# Patient Record
Sex: Female | Born: 1965 | Hispanic: Yes | Marital: Married | Smoking: Never smoker
Health system: Southern US, Community
[De-identification: ages and names within clinical notes are randomized; demographics above are authoritative.]

## PROBLEM LIST (undated history)

## (undated) DIAGNOSIS — I8390 Asymptomatic varicose veins of unspecified lower extremity: Secondary | ICD-10-CM

## (undated) DIAGNOSIS — D649 Anemia, unspecified: Secondary | ICD-10-CM

## (undated) DIAGNOSIS — J3489 Other specified disorders of nose and nasal sinuses: Secondary | ICD-10-CM

## (undated) DIAGNOSIS — R112 Nausea with vomiting, unspecified: Secondary | ICD-10-CM

## (undated) DIAGNOSIS — R631 Polydipsia: Secondary | ICD-10-CM

## (undated) DIAGNOSIS — Z8489 Family history of other specified conditions: Secondary | ICD-10-CM

## (undated) DIAGNOSIS — R7303 Prediabetes: Secondary | ICD-10-CM

## (undated) DIAGNOSIS — R197 Diarrhea, unspecified: Secondary | ICD-10-CM

## (undated) DIAGNOSIS — R140 Abdominal distension (gaseous): Secondary | ICD-10-CM

## (undated) DIAGNOSIS — M7989 Other specified soft tissue disorders: Secondary | ICD-10-CM

## (undated) DIAGNOSIS — K8020 Calculus of gallbladder without cholecystitis without obstruction: Secondary | ICD-10-CM

## (undated) HISTORY — PX: TUBAL LIGATION: SHX77

## (undated) HISTORY — DX: Diarrhea, unspecified: R19.7

## (undated) HISTORY — PX: ABDOMINAL HYSTERECTOMY: SHX81

## (undated) HISTORY — DX: Abdominal distension (gaseous): R14.0

## (undated) HISTORY — PX: CHOLECYSTECTOMY: SHX55

## (undated) HISTORY — DX: Anemia, unspecified: D64.9

## (undated) HISTORY — DX: Calculus of gallbladder without cholecystitis without obstruction: K80.20

## (undated) HISTORY — DX: Nausea with vomiting, unspecified: R11.2

---

## 2004-10-23 ENCOUNTER — Encounter (INDEPENDENT_AMBULATORY_CARE_PROVIDER_SITE_OTHER): Payer: Self-pay | Admitting: *Deleted

## 2004-10-23 LAB — CONVERTED CEMR LAB

## 2004-11-06 ENCOUNTER — Ambulatory Visit: Payer: Self-pay | Admitting: Family Medicine

## 2004-11-07 ENCOUNTER — Ambulatory Visit: Payer: Self-pay | Admitting: Family Medicine

## 2004-11-13 ENCOUNTER — Ambulatory Visit: Payer: Self-pay | Admitting: Family Medicine

## 2004-11-22 ENCOUNTER — Encounter: Admission: RE | Admit: 2004-11-22 | Discharge: 2004-11-22 | Payer: Self-pay | Admitting: Sports Medicine

## 2005-07-16 ENCOUNTER — Ambulatory Visit: Payer: Self-pay | Admitting: Family Medicine

## 2006-08-23 ENCOUNTER — Encounter (INDEPENDENT_AMBULATORY_CARE_PROVIDER_SITE_OTHER): Payer: Self-pay | Admitting: *Deleted

## 2010-10-03 ENCOUNTER — Other Ambulatory Visit (HOSPITAL_COMMUNITY): Payer: Self-pay | Admitting: Family Medicine

## 2010-10-03 DIAGNOSIS — Z8719 Personal history of other diseases of the digestive system: Secondary | ICD-10-CM

## 2010-10-05 ENCOUNTER — Ambulatory Visit (HOSPITAL_COMMUNITY)
Admission: RE | Admit: 2010-10-05 | Discharge: 2010-10-05 | Disposition: A | Discharge status: Home / Self Care | Payer: Self-pay | Source: Ambulatory Visit | Attending: Family Medicine | Admitting: Family Medicine

## 2010-10-05 DIAGNOSIS — Z8719 Personal history of other diseases of the digestive system: Secondary | ICD-10-CM

## 2010-10-05 DIAGNOSIS — R109 Unspecified abdominal pain: Secondary | ICD-10-CM | POA: Insufficient documentation

## 2010-10-05 DIAGNOSIS — K8020 Calculus of gallbladder without cholecystitis without obstruction: Secondary | ICD-10-CM | POA: Insufficient documentation

## 2011-01-08 ENCOUNTER — Emergency Department (HOSPITAL_COMMUNITY): Payer: Self-pay

## 2011-01-08 ENCOUNTER — Emergency Department (HOSPITAL_COMMUNITY)
Admission: EM | Admit: 2011-01-08 | Discharge: 2011-01-08 | Disposition: A | Discharge status: Home / Self Care | Payer: Self-pay | Attending: Emergency Medicine | Admitting: Emergency Medicine

## 2011-01-08 ENCOUNTER — Inpatient Hospital Stay (INDEPENDENT_AMBULATORY_CARE_PROVIDER_SITE_OTHER)
Admission: RE | Admit: 2011-01-08 | Discharge: 2011-01-08 | Disposition: A | Discharge status: Home / Self Care | Payer: Self-pay | Source: Ambulatory Visit | Attending: Emergency Medicine | Admitting: Emergency Medicine

## 2011-01-08 DIAGNOSIS — K8020 Calculus of gallbladder without cholecystitis without obstruction: Secondary | ICD-10-CM | POA: Insufficient documentation

## 2011-01-08 DIAGNOSIS — R1110 Vomiting, unspecified: Secondary | ICD-10-CM | POA: Insufficient documentation

## 2011-01-08 DIAGNOSIS — R10811 Right upper quadrant abdominal tenderness: Secondary | ICD-10-CM

## 2011-01-08 DIAGNOSIS — R1011 Right upper quadrant pain: Secondary | ICD-10-CM | POA: Insufficient documentation

## 2011-01-08 LAB — DIFFERENTIAL
Basophils Absolute: 0 10*3/uL (ref 0.0–0.1)
Basophils Relative: 0 % (ref 0–1)
Lymphs Abs: 1.5 10*3/uL (ref 0.7–4.0)
Monocytes Absolute: 0.7 10*3/uL (ref 0.1–1.0)
Monocytes Relative: 10 % (ref 3–12)
Neutro Abs: 5.2 10*3/uL (ref 1.7–7.7)
Neutrophils Relative %: 69 % (ref 43–77)

## 2011-01-08 LAB — CBC
HCT: 34.6 % — ABNORMAL LOW (ref 36.0–46.0)
MCH: 26.8 pg (ref 26.0–34.0)
RBC: 4.36 MIL/uL (ref 3.87–5.11)
WBC: 7.5 10*3/uL (ref 4.0–10.5)

## 2011-01-08 LAB — URINALYSIS, ROUTINE W REFLEX MICROSCOPIC
Bilirubin Urine: NEGATIVE
Hgb urine dipstick: NEGATIVE
Ketones, ur: NEGATIVE mg/dL
Specific Gravity, Urine: 1.022 (ref 1.005–1.030)
Urobilinogen, UA: 1 mg/dL (ref 0.0–1.0)
pH: 6.5 (ref 5.0–8.0)

## 2011-01-08 LAB — BASIC METABOLIC PANEL

## 2011-01-09 ENCOUNTER — Encounter (INDEPENDENT_AMBULATORY_CARE_PROVIDER_SITE_OTHER): Payer: Self-pay | Admitting: General Surgery

## 2011-01-12 ENCOUNTER — Ambulatory Visit (INDEPENDENT_AMBULATORY_CARE_PROVIDER_SITE_OTHER): Payer: PRIVATE HEALTH INSURANCE | Admitting: General Surgery

## 2011-01-12 ENCOUNTER — Encounter (INDEPENDENT_AMBULATORY_CARE_PROVIDER_SITE_OTHER): Payer: Self-pay | Admitting: General Surgery

## 2011-01-12 VITALS — BP 112/70 | HR 68 | Temp 96.6°F | Ht 61.5 in | Wt 174.6 lb

## 2011-01-12 DIAGNOSIS — K8020 Calculus of gallbladder without cholecystitis without obstruction: Secondary | ICD-10-CM

## 2011-01-12 NOTE — Patient Instructions (Signed)
Colelitiasis, enfermedad de la vescula biliar (Colecistitis con piedras) (Cholelithiasis) Usted tiene clculos biliares. Esta es una de las formas de enfermedad de la vescula biliar. Se denomina colecistitis a la inflamacin (irritacin y Engineer, mining) de este rgano. Generalmente est originada en la formacin de piedras (clculos biliares) o sedimentos (colelitiasis). La vescula biliar no es un rgano esencial. Esto significa que no es necesaria para estar vivo. Est ubicada ligeramente a la derecha del centro del abdomen (vientre), detrs del hgado. Almacena la bilis que se produce en el hgado. La bilis interviene en la digestin de las grasas. La enfermedad de la vescula biliar puede producir nuseas (ganas de vomitar), dolor abdominal e ictericia. En los [employer], podr ser [place]. Los clculos biliares son el tipo ms comn de enfermedad de la vescula. stos comienzan como pequeos cristales y lentamente se transforman en piedras. El dolor que producen los clculos se origina en los espasmos del conducto biliar. Estos espasmos se originan en el pasaje de la piedra hacia fuera del conducto. La piedra trata de pasar al mismo tiempo que la bilis hacia el intestino delgado, durante el proceso de la digestin. Generalmente el dolor comienza de manera repentina. Puede persistir durante algunos minutos hasta algunas horas. Tambin puede producirse una infeccin. La infeccin puede sumar molestias y gravedad a un ataque agudo. El dolor puede empeorar al respirar profundamente o al irritarse. Puede haber fiebre y dolor al tacto. En algunos casos, cuando los clculos biliares no se mueven hacia el conducto biliar, las personas no sienten dolor ni presentan sntomas. stos se denominan clculos "silenciosos". Las mujeres tienen tres veces ms probabilidades de Astronomer. Las mujeres que han tenido varios embarazos tienen ms probabilidades de Copywriter, advertising  colecistitis. Algunas veces los mdicos aconsejan extirpar los clculos biliares antes de futuros embarazos. Otros factores que aumentan el riesgo de sufrir la enfermedad son la obesidad, las dietas abundantes en alimentos fritos y en productos lcteos, el aumento de la edad, [place] uso prolongado de medicamentos que contengan hormonas femeninas y Glass blower/designer. INSTRUCCIONES PARA EL CUIDADO DOMICILIARIO  Utilice los medicamentos de venta libre o de prescripcin para Chief Technology Officer, el malestar o la [place], segn se lo indique el profesional que lo asiste.   Consuma una dieta baja en grasas hasta que concurra a una nueva visita mdica. (Las grasas hacen que la vescula se Technical sales engineer).   Concurra al control como se le indic. Los ataques casi siempre son recurrentes y generalmente habr que someterse a una ciruga como [place].  SOLICITE ATENCIN MDICA DE INMEDIATO SI:  El dolor aumenta y no puede controlarlo con los medicamentos.   La temperatura oral se eleva sin motivo por encima de101 y no puede controlarla con los medicamentos, o segn le indique el profesional que lo asiste.   Presenta nuseas o vmitos.  EST SEGURO QUE:   Comprende las instrucciones para el alta mdica.   Controlar su enfermedad.   Solicitar atencin mdica de inmediato segn las indicaciones.  Document Released: 03/28/2006 Document Re-Released: 05/24/2008 [facility] Patient Information 2011 [place], [place].  Colecistectoma abierta en el adulto Cuidados posteriores a la ciruga (Cholecystectomy, Open, Adult, Care After) Por favor, lea estas instrucciones y consltelas en las prximas semanas. Estas indicaciones le proporcionan informacin general acerca de cmo deber cuidarse despus de dejar el hospital Su cirujano podr darle instrucciones especficas. Aunque el tratamiento se ha planificado de acuerdo con las prcticas mdicas disponibles ms recientes, ocasionalmente pueden ocurrir complicaciones  inevitables. Si tiene problemas o surgen preguntas luego de recibir el alta, por favor comunquese telefnicamente con el cirujano al [employer] han dado. INSTRUCCIONES PARA EL CUIDADO DOMICILIARIO   Aplique hielo en la zona de la ciruga.   Ponga el hielo en una bolsa plstica.   Coloque una toalla entre la piel y Copy.   Aplique el hielo  minutos por vez,  veces al [employer] est despierto.   Cambie el vendaje tal como se le indic.   Mantenga la herida limpia y seca. Las heridas deben limpiarse diariamente con jabn y [place]. Seque con pequeos toques, sin frotar, luego de higienizarse Puede ducharse No haga baos de inmersin, ni utilice piscinas ni jacuzzis durante [employer], o segn lo que le haya indicado el profesional que lo asiste.   Utilice los medicamentos de venta libre o de prescripcin para Chief Technology Officer, Environmental health practitioner o la [place], segn se lo indique el profesional que lo asiste.   Puede reanudar su dieta y sus actividades normales segn se le haya indicado.   Durante tres semanas (o segn lo que le hayan indicado) no levante objetos pesados (ms de 5 kg [10 lb]) ni practique deportes de contacto.  SOLICITE ATENCIN MDICA SI:  Presenta enrojecimiento, hinchazn o aumento del dolor en la herida.   Observa pus en la zona de la herida.   Hay un drenaje en la herida que dura ms de Civil engineer, contracting.   Presenta una temperatura oral superior a 101   Advierte un olor ftido que proviene de la herida o del vendaje.   La herida se abre.   Nota un incremento del dolor en los hombros (en la zona donde van los breteles).   Presenta episodios de mareos o se siente dbil cuando est de pie.   Presenta nuseas o vmitos persistentes.  SOLICITE ATENCIN MDICA DE INMEDIATO SI:  Aparece una erupcin cutnea.   Presenta dificultad para respirar.   Aparece alguna reaccin o efecto secundario por los medicamentos administrados.  ASEGRESE QUE:  Comprende estas instrucciones.    Controlar su enfermedad.   Solicitar ayuda inmediatamente si no mejora o si empeora.  Document Released: 09/18/2007 Document Re-Released: 11/29/2009 [facility] Patient Information 2011 [place], [place].

## 2011-01-12 NOTE — Progress Notes (Signed)
Nili Honda is a 45 y.o. female.    Chief Complaint  Patient presents with  . Other    eval of gallbladder w/stones ref by Dr. Preston Fleeting    HPI HPI 45 year old non-English-speaking Hispanic female referred to our office by Dr. Dione Booze for evaluation of symptomatic cholelithiasis. She was diagnosed with gallstones 10 years ago. She used to have pain very infrequently. Now she is having epigastric and right upper quadrant pain at least once a week. It will radiate to her back. It'll be associated with nausea and vomiting. Fried and greasy foods will definitely trigger an episode. She denies any fevers or chills. She will get a little bit bloated. She denies any constipation, melena or hematochezia. She has had some loose stools. She denies any weight loss or jaundice. She went to the emergency room on July 16 for evaluation. An ultrasound there demonstrated multiple gallstones.  This interview was conducted with the aid of an interpreter.  Past Medical History  Diagnosis Date  . Gallstones   . Nausea & vomiting   . Diarrhea     Past Surgical History  Procedure Date  . Cesarean section     History reviewed. No pertinent family history.  Social History History  Substance Use Topics  . Smoking status: Never Smoker   . Smokeless tobacco: Not on file  . Alcohol Use: No    No Known Allergies  No current outpatient prescriptions on file.    Review of Systems Review of Systems  Constitutional: Negative for fever, chills and weight loss.  Eyes: Negative.   Respiratory: Negative for cough and shortness of breath.   Cardiovascular: Negative for chest pain and palpitations.  Gastrointestinal: Positive for nausea, vomiting and abdominal pain (see hpi).  Genitourinary: Negative for dysuria and urgency.  Musculoskeletal: Negative.   Skin: Negative for rash.  Neurological: Negative.  Negative for weakness.  Endo/Heme/Allergies: Negative.   Psychiatric/Behavioral: Negative.      Physical Exam Physical Exam  Constitutional: She is oriented to person, place, and time. She appears well-developed and well-nourished.  HENT:  Head: Normocephalic and atraumatic.  Eyes: Conjunctivae are normal. No scleral icterus.  Neck: Normal range of motion. No tracheal deviation present.  Cardiovascular: Normal rate, regular rhythm and normal heart sounds.   Respiratory: Effort normal and breath sounds normal. She has no wheezes.  GI: Soft. Bowel sounds are normal. She exhibits no distension. There is no tenderness. There is no rebound.       Lower midline incision  Musculoskeletal: Normal range of motion.  Lymphadenopathy:    She has no cervical adenopathy.  Neurological: She is alert and oriented to person, place, and time.  Skin: Skin is warm and dry.  Psychiatric: She has a normal mood and affect. Her behavior is normal.     Blood pressure 112/70, pulse 68, temperature 96.6 F (35.9 C), temperature source Temporal, height 5' 1.5" (1.562 m), weight 174 lb 9.6 oz (79.198 kg).  Data Reviewed: COMPLETE ABDOMINAL ULTRASOUND  Comparison: 10/05/2010  Findings:  Gallbladder: There are echogenic stones in the gallbladder with  posterior acoustic shadowing. Gallbladder wall roughly measures 2  mm. The patient reportedly does not have a sonographic Murphy's  sign.  Common bile duct: Common bile duct measures up to 3 mm.  Liver: No focal lesion identified. Mild heterogeneity of the liver  is nonspecific and could represent areas of fatty disease.  IVC: Appears normal.  Pancreas: No focal abnormality seen.  Spleen: Measures 8.1 cm in length.  Right Kidney: Measures 10.5 cm in length with normal renal  echotexture. No evidence for hydronephrosis.  Left Kidney: Measures 10.3 cm in length without hydronephrosis.  Abdominal aorta: No aneurysm identified.  IMPRESSION:  Cholelithiasis.  I reviewed Dr. Reynolds Bowl note from the emergency room.  Her urinalysis was normal her CBC and  basic metabolic panel were normal.  Assessment/Plan A 45 year old Hispanic female with symptomatic cholelithiasis. We discussed operative and nonoperative management of gallstone disease. I recommended a laparoscopic cholecystectomy since she is becoming more symptomatic. She was given Agricultural engineer. I discussed the procedure in detail.  The patient was given Agricultural engineer.  We discussed the risks and benefits of a laparoscopic cholecystectomy including, but not limited to bleeding, infection, injury to surrounding structures such as the intestine or liver, bile leak, retained gallstones, need to convert to an open procedure, prolonged diarrhea, blood clots such as  DVT, common bile duct injury, anesthesia risks, and possible need for additional procedures.  We discussed the typical post-operative recovery course.  Gaynelle Adu M 01/12/2011, 3:27 PM

## 2011-01-19 ENCOUNTER — Other Ambulatory Visit (INDEPENDENT_AMBULATORY_CARE_PROVIDER_SITE_OTHER): Payer: Self-pay | Admitting: General Surgery

## 2011-01-19 ENCOUNTER — Encounter (HOSPITAL_COMMUNITY): Payer: Self-pay | Attending: General Surgery

## 2011-01-19 DIAGNOSIS — K8020 Calculus of gallbladder without cholecystitis without obstruction: Secondary | ICD-10-CM | POA: Insufficient documentation

## 2011-01-19 DIAGNOSIS — Z01812 Encounter for preprocedural laboratory examination: Secondary | ICD-10-CM | POA: Insufficient documentation

## 2011-01-19 LAB — COMPREHENSIVE METABOLIC PANEL
AST: 27 U/L (ref 0–37)
CO2: 28 mEq/L (ref 19–32)
Chloride: 101 mEq/L (ref 96–112)
Glucose, Bld: 79 mg/dL (ref 70–99)
Total Protein: 7.7 g/dL (ref 6.0–8.3)

## 2011-01-19 LAB — CBC
Hemoglobin: 10.9 g/dL — ABNORMAL LOW (ref 12.0–15.0)
MCHC: 31.7 g/dL (ref 30.0–36.0)
RBC: 4.27 MIL/uL (ref 3.87–5.11)
WBC: 5.4 10*3/uL (ref 4.0–10.5)

## 2011-01-19 LAB — DIFFERENTIAL
Basophils Absolute: 0 10*3/uL (ref 0.0–0.1)
Eosinophils Relative: 1 % (ref 0–5)
Monocytes Absolute: 0.4 10*3/uL (ref 0.1–1.0)
Neutrophils Relative %: 50 % (ref 43–77)

## 2011-01-22 ENCOUNTER — Telehealth (INDEPENDENT_AMBULATORY_CARE_PROVIDER_SITE_OTHER): Payer: Self-pay | Admitting: General Surgery

## 2011-01-22 NOTE — Telephone Encounter (Signed)
Message copied by Liliana Cline on Mon Jan 22, 2011 12:47 PM ------      Message from: Andrey Campanile, ERIC M      Created: Mon Jan 22, 2011 10:29 AM       Labs ok for surgery

## 2011-01-22 NOTE — Telephone Encounter (Signed)
Labs ok sent to [facility].

## 2011-01-25 ENCOUNTER — Ambulatory Visit (INDEPENDENT_AMBULATORY_CARE_PROVIDER_SITE_OTHER): Payer: Self-pay | Admitting: General Surgery

## 2011-01-26 ENCOUNTER — Observation Stay (HOSPITAL_COMMUNITY)
Admission: RE | Admit: 2011-01-26 | Discharge: 2011-01-27 | Disposition: A | Discharge status: Home / Self Care | Payer: Self-pay | Source: Ambulatory Visit | Attending: General Surgery | Admitting: General Surgery

## 2011-01-26 ENCOUNTER — Other Ambulatory Visit (INDEPENDENT_AMBULATORY_CARE_PROVIDER_SITE_OTHER): Payer: Self-pay | Admitting: General Surgery

## 2011-01-26 ENCOUNTER — Ambulatory Visit (HOSPITAL_COMMUNITY): Payer: Self-pay

## 2011-01-26 DIAGNOSIS — Z7982 Long term (current) use of aspirin: Secondary | ICD-10-CM | POA: Insufficient documentation

## 2011-01-26 DIAGNOSIS — K8010 Calculus of gallbladder with chronic cholecystitis without obstruction: Secondary | ICD-10-CM

## 2011-01-26 DIAGNOSIS — Z79899 Other long term (current) drug therapy: Secondary | ICD-10-CM | POA: Insufficient documentation

## 2011-01-26 HISTORY — PX: LAPAROSCOPIC CHOLECYSTECTOMY W/ CHOLANGIOGRAPHY: SUR757

## 2011-01-30 ENCOUNTER — Telehealth (INDEPENDENT_AMBULATORY_CARE_PROVIDER_SITE_OTHER): Payer: Self-pay

## 2011-01-30 NOTE — Telephone Encounter (Signed)
Pt needs post op appt for gallbladder in 2-3 weeks.  Please schedule and call pt's daughter, Aram Beecham at [phone].

## 2011-01-30 NOTE — Op Note (Signed)
NAMEMAHIYA, KERCHEVAL NO.:  [id number]  MEDICAL RECORD NO.:  [id number]  LOCATION:  1529                         FACILITY:  [facility]  PHYSICIAN:  Mary Sella. Andrey Campanile, MD     DATE OF BIRTH:  12-31-1965  DATE OF PROCEDURE:  01/26/2011 DATE OF DISCHARGE:                              OPERATIVE REPORT   PREOPERATIVE DIAGNOSIS:  Symptomatic cholelithiasis.  POSTOPERATIVE DIAGNOSIS:  Symptomatic cholelithiasis.  PROCEDURE:  Laparoscopic cholecystectomy with intraoperative cholangiogram.  SURGEON:  Mary Sella. Andrey Campanile, M.D.  ASSISTANT SURGEON:  None.  ANESTHESIA:  General plus local consisting of 0.25% Marcaine with epinephrine.  FINDINGS:  The critical view was obtained.  The cholangiogram demonstrated prompt filling of the cystic common hepatic, common bile duct, and left and right hepatic ducts. The contrast emptied into the duodenum.  There was no evidence of filling defect.  SPECIMEN:  Gallbladder.  ESTIMATED BLOOD LOSS:  Minimal.  INDICATIONS FOR PROCEDURE:  The patient is a 45 year old Hispanic female who has had at least a 10-year history of gallstones.  However, most recently, she is having more frequent episodes of epigastric and right upper quadrant pain.  It will occur at least once a week.  It will radiate to her back.  It will be associated with nausea.  She has noticed a correlation with fried and greasy foods.  We discussed the risks and benefits of surgery including, but not limited to bleeding, infection, injury to surrounding structures, need to convert to an open procedure, bile leak, prolonged diarrhea, blood clot formation, injury to the common bile duct, requiring major reconstructive bile duct surgery, scarring, and hernia formation.  She elects to proceed to surgery.  DESCRIPTION OF PROCEDURE:  Obtaining informed consent, the patient was brought to the operating room, placed supine on the operating table. Sequential compression devices were  placed.  Her abdomen was prepped and draped in the usual standard surgical fashion.  She received antibiotics prior to skin incision.  A surgical time-out was performed.  I elected to gain entry in her belly button.  Local was infiltrated just beneath the base of her umbilicus.  Next, a vertical 1 inch infraumbilical incision was made with #11 blade.  The fascia was grasped and lifted anteriorly with Kochers.  The fascia was incised.  I had a difficult time entering the abdominal cavity.  I found a layer of tissue that appeared to be consistent with peritoneum.  However, she had had prior lower abdominal surgery and I was concerned that she might have adhesions up to the level of her umbilicus.  I was able to get my finger underneath the fascia, but I could not tell if I was preperitoneal or not.  Therefore, I elected to gain entry in the left upper quadrant with Optiview technique.  1 cm incision was made 2 fingerbreadths below the left subcostal margin.  I then advanced to 0-degree 5 mm scope through a 5 mm trocar under direct visualization through all layers of the abdominal wall smoothly entered the abdominal cavity.  Pneumoperitoneum was established to the patient's pressure of 15 mmHg.  I then looked at the umbilicus.  There were no adhesions at the umbilicus.  I was simply still preperitoneal.  At this point, I was able to advance the Hasson trocar through the fascial defect.  It had already placed a purse-string suture on the fascial edges using a 0-Vicryl on UR-6 needle.  We then placed the patient in reverse Trendelenburg and rotated slightly to the left.  I placed two 5-mm trocars in the right hypochondrium under direct visualization.  I removed the 5 mm trocar from the left upper quadrant and made a skin incision in the subxiphoid and placed it slightly to the right of the falciform ligament.  The gallbladder was grasped and lifted toward the right shoulder.  She had omental  attachments to the body of the gallbladder.  This were taken down with both hook electrocautery as well as in a blunt fashion.  The neck was grasped and tracked laterally.  I then incised the peritoneum both medially and laterally with hook electrocautery.  I was able to dissect out the cystic duct.  I was able to circumferentially dissect it out and around with aid of a [place].  I identified the cystic artery.  It appeared to be a short cystic artery coming off the right hepatic artery.  I was able to further gain some length on the cystic duct.  I was able to be the posterior aspect of the liver through the window.  I placed a clip distally on the cystic duct as it entered the gallbladder. It was then partially transected with Endoshears.  I then placed the [facility] cholangiogram catheter through the abdominal wall and threaded into the cystic duct and secured with a clip.  Pneumoperitoneum was released and the cholangiogram was performed using the C-arm.  Once the cholangiogram was performed with the results as described above, we reestablished pneumoperitoneum and placed her back in reverse Trendelenburg.  The clip securing the cholangiogram catheter was removed.  Three clips were placed on the downside of the cystic duct on the biliary aspect.  It was then transected the Endoshears.  I then started dissecting out more of the cystic artery. The cystic artery was not long.  I was able to get 2 clips on the downside of the cystic artery and one on the distal aspect as it entered the gallbladder was then transected with Endoshears.  We then rolled the gallbladder up at the gallbladder fossa.  This was done with hook electrocautery.  There was some spillage of bile; however, there was no spillage of gallstones. The gallbladder was freed.  I placed the Endobag through the umbilical trocar and placed gallbladder within the Endobag and removed the specimen from the abdominal cavity.   I did have to enlarge the skin defect a little bit in order to get the gallbladder, which had a large stone in it, out of the subcutaneous tissue.  We then replaced his on trocar and reinserted the gallbladder fossa and irrigated the right upper quadrant with 1.5 L of saline.  There was evidence of bile leak. However, there was one spot that was oozing in the gallbladder fossa.  I used hook electrocautery for hemostasis.  She continued to ooze a little bit, however, I bovied it again.  After having to bovie it twice, I elected to place a piece of surgical Ethicon snow in the gallbladder fossa.  The clips were securely across the cystic duct stump as well as the cystic artery stump.  I removed the Hassan trocar and tied down the previously placed pursestring suture.  There was  still a small little gap, therefore, I elected to place another 0-Vicryl  on UR-6 needle. However, as I was passing the needle through the fascia, the needle broke off from the suture.  Despite palpating and looking in the subcutaneous tissue for several minutes, I was unable to find it.  I looked in with the laparoscope and it had not fallen into the abdominal cavity.  Then, the plain x-ray was obtained, which demonstrated that was still in the abdominal wall.  I removed the previously tied purse-string suture at the fascia at the umbilicus.  I then taking electrocautery and lifted some of the skin off the fascia with electrocautery.  I then visualized the needle sitting on top of the fascia.  He was removed and placed on the back table.  I then reapproximated the fascial edges with 3 interrupted 0 Vicryls on UR-5 needle.  Prior to closing down the fascia, I did a finger sweep to make sure nothing was in the fascial closure and there was not.  The fascia was well approximated.  All skin incisions were closed with a 4-0 Monocryl followed by the application of benzoin, Steri-Strips, and sterile bandage.  All needle,  instruments, and pad counts were correct x2.  The patient was extubated and taken to the  recovery room in stable addition.     Mary Sella. Andrey Campanile, MD     EMW/MEDQ  D:  01/26/2011  T:  01/27/2011  Job:  [id number]  Electronically Signed by Gaynelle Adu M.D. on 01/30/2011 10:55:53 AM

## 2011-02-08 NOTE — Discharge Summary (Signed)
  NAMEMAIGEN, MOZINGO NO.:  [id number]  MEDICAL RECORD NO.:  [id number]  LOCATION:  1529                         FACILITY:  [facility]  PHYSICIAN:  Mary Sella. Andrey Campanile, MD     DATE OF BIRTH:  08-03-1965  DATE OF ADMISSION:  01/26/2011 DATE OF DISCHARGE:  01/27/2011                              DISCHARGE SUMMARY   ADMITTING DIAGNOSIS:  Symptomatic cholelithiasis.  DISCHARGE DIAGNOSIS:  Symptomatic cholelithiasis.  PROCEDURE DURING HOSPITALIZATION:  Laparoscopic cholecystectomy with intraoperative cholangiogram, January 26, 2011.  BRIEF HOSPITAL COURSE:  The patient was taken to the operating room on January 26, 2011, for laparoscopic cholecystectomy with intraoperative cholangiogram.  Please see operative note for further details. Postoperatively, she was kept overnight for control of nausea and some abdominal pain.  On postoperative day #1, she was seen stable for discharge.  She was tolerating a diet.  Her pain was controlled.  Her nausea had resolved.  She is deemed stable for discharge.  DISCHARGE MEDICATIONS:  Include Percocet 5/325 mg 1-2 tablets every 6 hours as needed for pain.  DISCHARGE/PLAN:  She is going to go home.  She is going to see me in office in 2 weeks.  She is instructed to call for fevers greater than 101.5, persistent nausea, vomiting, any signs of wound infection or any questions or concerns.  She was instructed not to soak in a bathtub or go swim.  She was also instructed not to lift anything greater than 10 pounds for 2 weeks.     Mary Sella. Andrey Campanile, MD     EMW/MEDQ  D:  02/03/2011  T:  02/03/2011  Job:  [id number]  Electronically Signed by Gaynelle Adu M.D. on 02/08/2011 10:53:13 AM

## 2011-02-15 ENCOUNTER — Ambulatory Visit (INDEPENDENT_AMBULATORY_CARE_PROVIDER_SITE_OTHER): Payer: PRIVATE HEALTH INSURANCE | Admitting: General Surgery

## 2011-02-15 ENCOUNTER — Encounter (INDEPENDENT_AMBULATORY_CARE_PROVIDER_SITE_OTHER): Payer: Self-pay | Admitting: General Surgery

## 2011-02-15 VITALS — BP 112/72 | HR 62

## 2011-02-15 DIAGNOSIS — Z9049 Acquired absence of other specified parts of digestive tract: Secondary | ICD-10-CM

## 2011-02-15 DIAGNOSIS — Z09 Encounter for follow-up examination after completed treatment for conditions other than malignant neoplasm: Secondary | ICD-10-CM

## 2011-02-15 DIAGNOSIS — Z9089 Acquired absence of other organs: Secondary | ICD-10-CM

## 2011-02-15 NOTE — Progress Notes (Signed)
Chief complaint: Postop  Procedure: Laparoscopic cholecystectomy with intraoperative cholangiogram on January 26, 2011  History of Present Ilness: 45 year old Hispanic female comes in today for her postoperative appointment. She is accompanied by her husband. This interview was done with the aid of a translator. She denies any fevers or chills. She denies any nausea or vomiting. She denies any diarrhea or constipation. She only took narcotics for 2 days. She reports a normal appetite. She has some occasional bloating. She denies any abdominal pain.  Physical Exam: BP 112/72  Pulse 62  Well-developed well-nourished obese Hispanic female in no apparent distress Pulmonary-lungs are clear to auscultation Cardiac-regular rate and rhythm Abdomen-soft, nontender, nondistended. Well-healed trocar incisions. No signs of incisional hernia. No cellulitis. Skin-no jaundice  Pathology: Gallbladder showed chronic cholecystitis and cholelithiasis  Assessment and Plan: Status post laparoscopic cholecystectomy with interoperative cholangiogram for symptomatic chronic cholelithiasis and cholecystitis-doing well.  We discussed her pathology report. I gave her a note allowing her to return to work in one week with weight restrictions.  I will see her on an as-needed basis

## 2011-02-15 NOTE — Patient Instructions (Signed)
Return to work in 1 week 

## 2013-02-06 ENCOUNTER — Other Ambulatory Visit: Payer: Self-pay | Admitting: *Deleted

## 2013-02-06 DIAGNOSIS — N6310 Unspecified lump in the right breast, unspecified quadrant: Secondary | ICD-10-CM

## 2013-02-10 ENCOUNTER — Ambulatory Visit (HOSPITAL_COMMUNITY)
Admission: RE | Admit: 2013-02-10 | Discharge: 2013-02-10 | Disposition: A | Discharge status: Home / Self Care | Payer: Self-pay | Source: Ambulatory Visit | Attending: Obstetrics and Gynecology | Admitting: Obstetrics and Gynecology

## 2013-02-10 ENCOUNTER — Encounter (HOSPITAL_COMMUNITY): Payer: Self-pay

## 2013-02-10 VITALS — BP 100/62 | Temp 97.8°F | Ht 60.5 in | Wt 169.8 lb

## 2013-02-10 DIAGNOSIS — N6311 Unspecified lump in the right breast, upper outer quadrant: Secondary | ICD-10-CM | POA: Insufficient documentation

## 2013-02-10 DIAGNOSIS — Z01419 Encounter for gynecological examination (general) (routine) without abnormal findings: Secondary | ICD-10-CM

## 2013-02-10 NOTE — Patient Instructions (Signed)
Taught [name] how to perform BSE and gave educational materials to take home. Let her know [facility] will cover Pap smears every 3 years unless has a history of abnormal Pap smears. Referred patient to the [facility] for diagnostic mammogram and right breast ultrasound. Appointment scheduled for Thursday, February 26, 2013 at 1430. Patient aware of appointment and will be there. Let patient know will follow up with her within the next couple weeks with results by letter or phone. Julie [name] verbalized understanding.  Karry Barrilleaux, Kathaleen Maser, RN 1:54 PM

## 2013-02-10 NOTE — Progress Notes (Signed)
Complaints of right breast lump x 2 months and left breast pain that patient rates at a 3 out of 10 that comes and goes.  Pap Smear:  Pap smear completed today. Patients last Pap smear was 3 years in [place] and normal per patient. Per patient no history of an abnormal Pap smear. No Pap smear results in EPIC.  Physical exam: Breasts Breasts symmetrical. No skin abnormalities bilateral breasts. No nipple retraction bilateral breasts. No nipple discharge bilateral breasts. No lymphadenopathy. No lumps palpated left breast. Palpated a pea sized lump within the right breast at 11 o'clock 9 cm from the nipple. Patient complained of left breast tenderness on exam. Referred patient to the [facility] for diagnostic mammogram and right breast ultrasound. Appointment scheduled for Thursday, February 26, 2013 at 1430.No complaints today.         Pelvic/Bimanual   Ext Genitalia No lesions, no swelling and no discharge observed on external genitalia.         Vagina Vagina pink and normal texture. No lesions or discharge observed in vagina.          Cervix Cervix is present. Cervix pink and of normal texture. No discharge observed.     Uterus Uterus is present and palpable. Uterus in normal position and normal size.        Adnexae Bilateral ovaries present and palpable. No tenderness on palpation.          Rectovaginal No rectal exam completed today since patient had no rectal complaints. No skin abnormalities observed on exam.

## 2013-02-13 ENCOUNTER — Telehealth (HOSPITAL_COMMUNITY): Payer: Self-pay | Admitting: *Deleted

## 2013-02-13 NOTE — Telephone Encounter (Signed)
Telephoned patient at home # with interpreter Delorise Royals and discussed negative pap smear results. Next pap smear due in 3 years. Patient voiced understanding.

## 2013-02-26 ENCOUNTER — Ambulatory Visit
Admission: RE | Admit: 2013-02-26 | Discharge: 2013-02-26 | Disposition: A | Discharge status: Home / Self Care | Payer: No Typology Code available for payment source | Source: Ambulatory Visit | Attending: Obstetrics and Gynecology | Admitting: Obstetrics and Gynecology

## 2013-02-26 DIAGNOSIS — N6310 Unspecified lump in the right breast, unspecified quadrant: Secondary | ICD-10-CM

## 2013-10-02 ENCOUNTER — Other Ambulatory Visit: Payer: Self-pay | Admitting: Family Medicine

## 2013-10-02 ENCOUNTER — Ambulatory Visit (INDEPENDENT_AMBULATORY_CARE_PROVIDER_SITE_OTHER): Payer: BC Managed Care – HMO | Admitting: Family Medicine

## 2013-10-02 VITALS — BP 120/60 | HR 82 | Temp 98.0°F | Resp 16 | Ht 60.0 in | Wt 176.0 lb

## 2013-10-02 DIAGNOSIS — Z0000 Encounter for general adult medical examination without abnormal findings: Secondary | ICD-10-CM

## 2013-10-02 DIAGNOSIS — R945 Abnormal results of liver function studies: Secondary | ICD-10-CM

## 2013-10-02 DIAGNOSIS — Z13220 Encounter for screening for lipoid disorders: Secondary | ICD-10-CM

## 2013-10-02 DIAGNOSIS — D509 Iron deficiency anemia, unspecified: Secondary | ICD-10-CM

## 2013-10-02 DIAGNOSIS — N920 Excessive and frequent menstruation with regular cycle: Secondary | ICD-10-CM

## 2013-10-02 DIAGNOSIS — Z1329 Encounter for screening for other suspected endocrine disorder: Secondary | ICD-10-CM

## 2013-10-02 DIAGNOSIS — R7989 Other specified abnormal findings of blood chemistry: Secondary | ICD-10-CM

## 2013-10-02 DIAGNOSIS — J3489 Other specified disorders of nose and nasal sinuses: Secondary | ICD-10-CM

## 2013-10-02 LAB — POCT CBC
GRANULOCYTE PERCENT: 49.5 % (ref 37–80)
HCT, POC: 30.4 % — AB (ref 37.7–47.9)
HEMOGLOBIN: 9 g/dL — AB (ref 12.2–16.2)
Lymph, poc: 1.5 (ref 0.6–3.4)
MCH, POC: 20 pg — AB (ref 27–31.2)
MCHC: 29.6 g/dL — AB (ref 31.8–35.4)
MCV: 67.3 fL — AB (ref 80–97)
MID (cbc): 0.3 (ref 0–0.9)
MPV: 8 fL (ref 0–99.8)
PLATELET COUNT, POC: 284 10*3/uL (ref 142–424)
POC GRANULOCYTE: 1.8 — AB (ref 2–6.9)
POC LYMPH %: 41.8 % (ref 10–50)
POC MID %: 8.7 %M (ref 0–12)
RBC: 4.51 M/uL (ref 4.04–5.48)
RDW, POC: 19.1 %
WBC: 3.6 10*3/uL — AB (ref 4.6–10.2)

## 2013-10-02 MED ORDER — IPRATROPIUM BROMIDE 0.03 % NA SOLN: 2.0000 | Freq: Four times a day (QID) | NASAL | Status: DC

## 2013-10-02 NOTE — Patient Instructions (Addendum)
You are anemic- this may be due to heavy menses.  I will check your iron level and get back to you as soon as possible.  I will also be in touch with the rest of your labs.   Your blood pressure looks good  If you are interested in having treatment for your varicose veins please get in touch with a vein and vascular clinic.  One option is  [facility] [place], [place], [place] [address]  [phone]  For the time being it appears that you have a cold.  Try taking claritin over the counter- you can also use some delsym for cough.  We will set up an ultrasound for you and get in touch with this appointment.  I think you are anemic due to your heavy periods.    Try taking tylenol for your aches and headache.

## 2013-10-02 NOTE — Progress Notes (Addendum)
[facility] [address], [place] [address] [phone]  Date:  10/02/2013   Name:  Julie [name]   DOB:  01/11/1966   MRN:  [id number]  PCP:  PROVIDER NOT IN SYSTEM    Chief Complaint: Annual Exam   History of Present Illness:  Julie [name] is a 48 y.o. very pleasant female patient who presents with the following:  She is here today for a physical as a new patient. Also notes several other concerns today.  She has has a cough and runny nose for one day.  She had abdominal pain Tuesday night- this is now gone.   They though she had a fever when she had this pain- this is now resolved.  However she states she will occasionally have upper abdomen pain; this has been the case for some time.  Her gallbladder was removed in 2012 but unfortunately her abdominal pain persisted.  In fact, she has pain "all over" her body.   She has nausea but no vomiting. No diarrhea.   She is still eating ok.   She has had a ST and headache.    She has not taken any medication for her sx Her last pap was in August of 2014.  Last mammogram in September of 2014. No history of abnormal pap  She has very heavy menses that may last for 8 days at a time.  She notes that she may have to change her pad every 30 minutes or so.  This is a change over the last few years.  "I dread the first day of my period."  LMP 09/13/13.    Patient Active Problem List   Diagnosis Date Noted  . Breast lump on right side at 11 o'clock position 02/10/2013    Past Medical History  Diagnosis Date  . Gallstones   . Nausea & vomiting   . Diarrhea   . Bloated abdomen     post surgery     Past Surgical History  Procedure Laterality Date  . Cesarean section    . Laparoscopic cholecystectomy w/ cholangiography  01/26/11  . Tubal ligation      History  Substance Use Topics  . Smoking status: Never Smoker   . Smokeless tobacco: Never Used  . Alcohol Use: No    History reviewed.  No pertinent family history.  No Known Allergies  Medication list has been reviewed and updated.  Current Outpatient Prescriptions on File Prior to Visit  Medication Sig Dispense Refill  . Multiple Vitamin (MULTIVITAMIN) tablet Take 1 tablet by mouth daily.       No current facility-administered medications on file prior to visit.    Review of Systems:  As per HPI- otherwise negative.   Physical Examination: Filed Vitals:   10/02/13 1614  BP: 120/60  Pulse: 82  Temp: 98 F (36.7 C)  Resp: 16   Filed Vitals:   10/02/13 1614  Height: 5' (1.524 m)  Weight: 176 lb (79.833 kg)   Body mass index is 34.37 kg/(m^2). Ideal Body Weight: Weight in (lb) to have BMI = 25: 127.7  GEN: WDWN, NAD, Non-toxic, A & O x 3, obese, looks well HEENT: Atraumatic, Normocephalic. Neck supple. No masses, No LAD.  Bilateral TM wnl, oropharynx normal.  PEERL,EOMI.   Ears and Nose: No external deformity. CV: RRR, No M/G/R. No JVD. No thrill. No extra heart sounds. PULM: CTA B, no wheezes, crackles, rhonchi. No retractions. No resp. distress.  No accessory muscle use. ABD: S, NT, ND, +BS. No rebound. No HSM. EXTR: No c/c/e NEURO Normal gait.  PSYCH: Normally interactive. Conversant. Not depressed or anxious appearing.  Calm demeanor.   Results for orders placed in visit on 10/02/13  POCT CBC      Result Value Ref Range   WBC 3.6 (*) 4.6 - 10.2 K/uL   Lymph, poc 1.5  0.6 - 3.4   POC LYMPH PERCENT 41.8  10 - 50 %L   MID (cbc) 0.3  0 - 0.9   POC MID % 8.7  0 - 12 %M   POC Granulocyte 1.8 (*) 2 - 6.9   Granulocyte percent 49.5  37 - 80 %G   RBC 4.51  4.04 - 5.48 M/uL   Hemoglobin 9.0 (*) 12.2 - 16.2 g/dL   HCT, POC 30.4 (*) 37.7 - 47.9 %   MCV 67.3 (*) 80 - 97 fL   MCH, POC 20.0 (*) 27 - 31.2 pg   MCHC 29.6 (*) 31.8 - 35.4 g/dL   RDW, POC 19.1     Platelet Count, POC 284  142 - 424 K/uL   MPV 8.0  0 - 99.8 fL    Assessment and Plan: Physical exam - Plan: POCT CBC, Comprehensive  metabolic panel  Screening for hyperlipidemia - Plan: Lipid panel  Screening for hypothyroidism - Plan: TSH  Microcytic anemia - Plan: Ferritin  Menorrhagia - Plan: US Pelvis Complete  Rhinorrhea - Plan: ipratropium (ATROVENT) 0.03 % nasal spray  Physical exam and several other concerns as above.  Tetanus is due but will defer today as she is ill Suspect a viral URI.  Suggested OTC medications, rx for atrovent nasal Anemia: likely due to menorrhagia. Ferritin pending.  She is interested in pursuing further evaluation of this issue. Refer for ultrasound, plan OBG referral pending these results Other labs pending as above.  Recurrent abdominal pain.  She does not have this pain now so it is difficult to determine the cause.  She will seek evaluation the next time she has the pain.  Remind about tetanus shot with labs  Signed Lamar Blinks, MD  Called and 4/16: pt was out but was able to speak with her husband.  Labs look ok except her LFTs are somewhat high.  He reports that her abdominal pain has not come back; she is doing well in this regard. I will order a repeat liver panel- they will come and have this done as a lab visit only in about one week, schedule abdominal ultrasound as well  Start iron for microcytic anemia. Was able to speak with pt herself later in the afternoon

## 2013-10-03 LAB — LIPID PANEL
CHOL/HDL RATIO: 2.4 ratio
CHOLESTEROL: 132 mg/dL (ref 0–200)
HDL: 55 mg/dL (ref >39)
LDL CALC: 69 mg/dL (ref 0–99)
TRIGLYCERIDES: 42 mg/dL (ref <150)
VLDL: 8 mg/dL (ref 0–40)

## 2013-10-03 LAB — COMPREHENSIVE METABOLIC PANEL
ALBUMIN: 4.2 g/dL (ref 3.5–5.2)
ALK PHOS: 179 U/L — AB (ref 39–117)
ALT: 87 U/L — ABNORMAL HIGH (ref 0–35)
AST: 47 U/L — AB (ref 0–37)
BUN: 11 mg/dL (ref 6–23)
CALCIUM: 9.1 mg/dL (ref 8.4–10.5)
CO2: 24 mEq/L (ref 19–32)
CREATININE: 0.54 mg/dL (ref 0.50–1.10)
Chloride: 102 mEq/L (ref 96–112)
GLUCOSE: 83 mg/dL (ref 70–99)
Potassium: 4.2 mEq/L (ref 3.5–5.3)
SODIUM: 136 meq/L (ref 135–145)
Total Bilirubin: 0.2 mg/dL (ref 0.2–1.2)
Total Protein: 6.9 g/dL (ref 6.0–8.3)

## 2013-10-03 LAB — FERRITIN: FERRITIN: 10 ng/mL (ref 10–291)

## 2013-10-03 LAB — TSH: TSH: 1.927 u[IU]/mL (ref 0.350–4.500)

## 2013-10-05 ENCOUNTER — Other Ambulatory Visit: Payer: Self-pay | Admitting: Family Medicine

## 2013-10-05 DIAGNOSIS — N920 Excessive and frequent menstruation with regular cycle: Secondary | ICD-10-CM

## 2013-10-07 LAB — HEPATITIS PANEL, ACUTE
HCV Ab: NEGATIVE
HEP A IGM: NONREACTIVE
Hep B C IgM: NONREACTIVE
Hepatitis B Surface Ag: NEGATIVE

## 2013-10-08 ENCOUNTER — Encounter: Payer: Self-pay | Admitting: Family Medicine

## 2013-10-08 MED ORDER — FERROUS SULFATE 325 (65 FE) MG PO TABS: 325.0000 mg | ORAL_TABLET | Freq: Two times a day (BID) | ORAL | Status: DC

## 2013-10-08 NOTE — Addendum Note (Signed)
Addended by: Lamar Blinks C on: 10/08/2013 01:40 PM   Modules accepted: Orders

## 2013-10-16 ENCOUNTER — Encounter: Payer: Self-pay | Admitting: Family Medicine

## 2013-10-16 ENCOUNTER — Ambulatory Visit
Admission: RE | Admit: 2013-10-16 | Discharge: 2013-10-16 | Disposition: A | Discharge status: Home / Self Care | Payer: BC Managed Care – PPO | Source: Ambulatory Visit | Attending: Family Medicine | Admitting: Family Medicine

## 2013-10-16 ENCOUNTER — Telehealth: Payer: Self-pay | Admitting: Family Medicine

## 2013-10-16 DIAGNOSIS — N920 Excessive and frequent menstruation with regular cycle: Secondary | ICD-10-CM

## 2013-10-16 DIAGNOSIS — R7989 Other specified abnormal findings of blood chemistry: Secondary | ICD-10-CM

## 2013-10-16 DIAGNOSIS — R945 Abnormal results of liver function studies: Principal | ICD-10-CM

## 2013-10-16 NOTE — Telephone Encounter (Signed)
Received her ultrasound reports.  Called and LMOM- will refer to OBG for her fibroids that are causing excessive bleeding

## 2014-01-28 ENCOUNTER — Other Ambulatory Visit: Payer: Self-pay

## 2014-01-28 DIAGNOSIS — Z1231 Encounter for screening mammogram for malignant neoplasm of breast: Secondary | ICD-10-CM

## 2014-02-15 ENCOUNTER — Other Ambulatory Visit: Payer: Self-pay | Admitting: *Deleted

## 2014-02-15 DIAGNOSIS — I83893 Varicose veins of bilateral lower extremities with other complications: Secondary | ICD-10-CM

## 2014-03-22 ENCOUNTER — Ambulatory Visit
Admission: RE | Admit: 2014-03-22 | Discharge: 2014-03-22 | Disposition: A | Discharge status: Home / Self Care | Payer: BC Managed Care – PPO | Source: Ambulatory Visit

## 2014-03-22 DIAGNOSIS — Z1231 Encounter for screening mammogram for malignant neoplasm of breast: Secondary | ICD-10-CM

## 2014-04-12 ENCOUNTER — Encounter: Payer: Self-pay | Admitting: Vascular Surgery

## 2014-04-13 ENCOUNTER — Ambulatory Visit (INDEPENDENT_AMBULATORY_CARE_PROVIDER_SITE_OTHER): Payer: BC Managed Care – PPO | Admitting: Vascular Surgery

## 2014-04-13 ENCOUNTER — Ambulatory Visit (HOSPITAL_COMMUNITY)
Admission: RE | Admit: 2014-04-13 | Discharge: 2014-04-13 | Disposition: A | Discharge status: Home / Self Care | Payer: BC Managed Care – PPO | Source: Ambulatory Visit | Attending: Vascular Surgery | Admitting: Vascular Surgery

## 2014-04-13 ENCOUNTER — Encounter: Payer: Self-pay | Admitting: Vascular Surgery

## 2014-04-13 VITALS — BP 122/77 | HR 57 | Ht 60.0 in | Wt 171.0 lb

## 2014-04-13 DIAGNOSIS — I83893 Varicose veins of bilateral lower extremities with other complications: Secondary | ICD-10-CM

## 2014-04-13 DIAGNOSIS — I8393 Asymptomatic varicose veins of bilateral lower extremities: Secondary | ICD-10-CM | POA: Diagnosis present

## 2014-04-13 DIAGNOSIS — I83899 Varicose veins of unspecified lower extremities with other complications: Secondary | ICD-10-CM | POA: Insufficient documentation

## 2014-04-13 NOTE — Progress Notes (Signed)
Subjective:     Patient ID: Julie [name], female   DOB: [birth year], 48 y.o.   MRN: [id number]  HPI this 48 year old female is evaluated for painful varicosities primarily in the right leg. She has had bulging varicosities in the medial thigh and calf for the past 10-12 years which have gradually increased in size and have become more painful. She describes aching throbbing and burning discomfort as well as progressive edema in the right ankle. She does not work elastic compression stockings currently although she does have long leg stockings which she wore several years ago. She does not elevate the legs nor take pain medication. She has no history of DVT, thrombophlebitis, stasis ulcers, bleeding, or pulmonary embolus. Her symptoms are continuing to worsen and affect her daily living. She is on her feet most of the day.  Past Medical History  Diagnosis Date  . Gallstones   . Nausea & vomiting   . Diarrhea   . Bloated abdomen     post surgery   . Anemia     History  Substance Use Topics  . Smoking status: Never Smoker   . Smokeless tobacco: Never Used  . Alcohol Use: Yes     Comment: occ    Family History  Problem Relation Age of Onset  . Cancer Mother   . Hypertension Mother   . Clotting disorder Mother     No Known Allergies  Current outpatient prescriptions:ferrous sulfate 325 (65 FE) MG tablet, Take 1 tablet (325 mg total) by mouth 2 (two) times daily with a meal., Disp: 60 tablet, Rfl: 2;  ipratropium (ATROVENT) 0.03 % nasal spray, Place 2 sprays into the nose 4 (four) times daily., Disp: 30 mL, Rfl: 6;  Multiple Vitamin (MULTIVITAMIN) tablet, Take 1 tablet by mouth daily., Disp: , Rfl:   BP 122/77  Pulse 57  Ht 5' (1.524 m)  Wt 171 lb (77.565 kg)  BMI 33.40 kg/m2  SpO2 98%  Body mass index is 33.4 kg/(m^2).           Review of Systems denies chest pain, dyspnea on exertion, PND, orthopnea. Complains of pain in legs with walking and was lying flat,  swelling, weakness in legs, numbness in legs, dizziness, bleeding problems, and hematuria one time. All systems other than this are negative and complete review of systems    Objective:   Physical Exam BP 122/77  Pulse 57  Ht 5' (1.524 m)  Wt 171 lb (77.565 kg)  BMI 33.40 kg/m2  SpO2 98%  Gen.-alert and oriented x3 in no apparent distress HEENT normal for age Lungs no rhonchi or wheezing Cardiovascular regular rhythm no murmurs carotid pulses 3+ palpable no bruits audible Abdomen soft nontender no palpable masses Musculoskeletal free of  major deformities Skin clear -no rashes Neurologic normal Lower extremities 3+ femoral and dorsalis pedis pulses palpable bilaterally with no edema on the left 1+ edema on the right Bulging varicosities right medial calf and extending up to the distal medial thigh with 1+ edema but no active ulceration.  Left leg has bulging varicosities and medial calf which are not as large but are present. No hyperpigmentation noted.  Today I ordered venous duplex exam of both legs are reviewed and interpreted. The right leg has gross reflux throughout the great saphenous vein which is a large caliber vein from the mid calf to near the saphenofemoral junction. Left leg has reflux in the very proximal great saphenous vein but no reflux distally. There is no  DVT bilaterally.  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

## 2014-04-15 ENCOUNTER — Other Ambulatory Visit: Payer: Self-pay | Admitting: Family Medicine

## 2014-04-26 ENCOUNTER — Encounter: Payer: Self-pay | Admitting: Vascular Surgery

## 2014-05-11 ENCOUNTER — Telehealth: Payer: Self-pay

## 2014-05-11 NOTE — Telephone Encounter (Addendum)
Patient came in to the walk in requesting her results from her ultra sound of her pelvic and liver. Patient stated it was done early part of this year. She also stated she was being referred to a OBGYN. Patient speaks very little english so I wasn't sure if she was saying she had already seen a OBGYN or if she hadn't. Patients call back number is [phone] (spanish speaking)

## 2014-05-11 NOTE — Telephone Encounter (Signed)
lmom detailing patient to clarifying what she needs exactly. Told patient she may need to fill out a release of medical records if she only wants copies of her results. Told her I would be her till 4:30p to help fill that out if she needs it.

## 2014-05-11 NOTE — Telephone Encounter (Signed)
Spoke to [place]. She is going to call pt to see if she can get more clear of an answer as to what pt is requesting.

## 2014-07-20 ENCOUNTER — Ambulatory Visit: Payer: BC Managed Care – PPO | Admitting: Vascular Surgery

## 2014-08-02 ENCOUNTER — Encounter: Payer: Self-pay | Admitting: Vascular Surgery

## 2014-08-03 ENCOUNTER — Ambulatory Visit: Payer: Self-pay | Admitting: Vascular Surgery

## 2014-10-14 ENCOUNTER — Encounter: Payer: Self-pay | Admitting: Family Medicine

## 2015-01-04 ENCOUNTER — Ambulatory Visit: Payer: Self-pay | Attending: Internal Medicine

## 2015-01-04 ENCOUNTER — Ambulatory Visit: Payer: Self-pay

## 2015-01-13 ENCOUNTER — Ambulatory Visit: Payer: Self-pay

## 2015-01-18 ENCOUNTER — Ambulatory Visit: Payer: Self-pay | Attending: Internal Medicine

## 2015-04-22 LAB — GLUCOSE, POCT (MANUAL RESULT ENTRY): POC GLUCOSE: 115 mg/dL — AB (ref 70–99)

## 2015-05-11 ENCOUNTER — Other Ambulatory Visit: Payer: Self-pay

## 2015-05-23 ENCOUNTER — Ambulatory Visit: Payer: Self-pay | Attending: Internal Medicine

## 2015-06-06 ENCOUNTER — Other Ambulatory Visit: Payer: Self-pay | Admitting: Obstetrics and Gynecology

## 2015-06-06 DIAGNOSIS — Z1231 Encounter for screening mammogram for malignant neoplasm of breast: Secondary | ICD-10-CM

## 2015-06-15 ENCOUNTER — Telehealth (HOSPITAL_COMMUNITY): Payer: Self-pay | Admitting: *Deleted

## 2015-06-15 NOTE — Telephone Encounter (Signed)
Telephoned patient at home # and confirmed BCCCP appointment for Dec 23 9:00. Used interpreter Lavon Paganini.

## 2015-06-16 ENCOUNTER — Encounter (HOSPITAL_COMMUNITY): Payer: Self-pay

## 2015-06-16 ENCOUNTER — Ambulatory Visit (HOSPITAL_COMMUNITY)
Admission: RE | Admit: 2015-06-16 | Discharge: 2015-06-16 | Disposition: A | Discharge status: Home / Self Care | Payer: Self-pay | Source: Ambulatory Visit | Attending: Obstetrics and Gynecology | Admitting: Obstetrics and Gynecology

## 2015-06-16 VITALS — BP 112/70 | Temp 97.6°F | Ht 62.0 in | Wt 164.0 lb

## 2015-06-16 DIAGNOSIS — Z1239 Encounter for other screening for malignant neoplasm of breast: Secondary | ICD-10-CM

## 2015-06-16 NOTE — Progress Notes (Signed)
No complaints today.  Pap Smear: Pap smear not completed today. Last Pap smear was 02/10/2013 at [facility] and normal. Per patient has no history of an abnormal Pap smear. Last Pap smear result is in EPIC.  Physical exam: Breasts Breasts symmetrical. No skin abnormalities bilateral breasts. No nipple retraction bilateral breasts. No nipple discharge bilateral breasts. No lymphadenopathy. No lumps palpated bilateral breasts. No complaints of pain or tenderness on exam. Referred patient to the [facility] for a screening mammogram. Appointment scheduled for Friday, June 24, 2015 at 1100.   Pelvic/Bimanual No Pap smear completed today since last Pap smear was 02/10/2013. Pap smear not indicated per BCCCP guidelines.   Smoking History: Patient has never smoked.  Patient Navigation: Patient education and Spanish Interpreter provided. Access to services provided for patient through [name] program. Patient referred to [facility] program and appointment scheduled for June 23, 2015. Referral sent to the [facility] for AUB. Financial Assistance Paperwork given and patient has been approved for the [place] card.   Colorectal Cancer Screening: Patient has not had a colonoscopy completed. Gave her resources and that she needed to have completed since over 48.   Used Spanish interpreter [employer].

## 2015-06-16 NOTE — Patient Instructions (Addendum)
Educational materials on self breast awareness given. Explained to [employer] that she did not need a Pap smear today due to last Pap smear was 02/10/2013. Let her know B[facility]CP will cover Pap smears every 3 years unless has a history of abnormal Pap smears. Reminded patient that her next Pap smear is due in August 2017 and call Gabriel Cirri to schedule to have completed in [place]. Referred patient to the [facility] for a screening mammogram. Appointment scheduled for Friday, June 24, 2015 at 1100. Patient aware of appointment and will be there. Let patient know the Breast Center will follow up with her within the next couple weeks with results by letter or phone. [place] verbalized understanding.  [place], Arvil Chaco, RN 10:40 AM

## 2015-06-16 NOTE — Addendum Note (Signed)
Encounter addended by: Loletta Parish, RN on: 06/16/2015 10:50 AM<BR>     Documentation filed: Patient Instructions Section

## 2015-06-23 ENCOUNTER — Other Ambulatory Visit: Payer: Self-pay

## 2015-06-23 ENCOUNTER — Ambulatory Visit (HOSPITAL_BASED_OUTPATIENT_CLINIC_OR_DEPARTMENT_OTHER): Payer: Self-pay

## 2015-06-23 VITALS — BP 108/70 | HR 56 | Temp 98.0°F | Resp 16 | Ht 60.0 in | Wt 159.1 lb

## 2015-06-23 DIAGNOSIS — Z0000 Encounter for general adult medical examination without abnormal findings: Secondary | ICD-10-CM

## 2015-06-23 LAB — LIPID PANEL
CHOL/HDL RATIO: 3.3 ratio (ref <=5.0)
CHOLESTEROL: 170 mg/dL (ref 125–200)
HDL: 52 mg/dL (ref >=46)
LDL Cholesterol: 110 mg/dL (ref <130)
Triglycerides: 40 mg/dL (ref <150)
VLDL: 8 mg/dL (ref <30)

## 2015-06-23 LAB — HEMOGLOBIN A1C
HEMOGLOBIN A1C: 5.1 % (ref <5.7)
MEAN PLASMA GLUCOSE: 100 mg/dL (ref <117)

## 2015-06-23 LAB — GLUCOSE (CC13): Glucose: 81 mg/dl (ref 70–140)

## 2015-06-23 NOTE — Progress Notes (Signed)
Patient is a new patient to the [facility] program and is currently a [facility]CP patient effective 06/16/2015 with interpreter Lavon Paganini.   Clinical Measurements: Patient is 5 ft.,  weight 159.1 lbs, and BMI 31.1.   Medical History: Patient stated has no treated history of high cholesterol. Patient stated that 3 years ago was told was pre diabetic and cholesterol was up some. Patient stated that changed diet.  Patient does not have a history of hypertension or diabetes. Per patient no diagnosed history of coronary heart disease, heart attack, heart failure, stroke/TIA, vascular disease or congenital heart defects.   Blood Pressure, Self-measurement: Patient states has no reason to check Blood pressure.  Nutrition Assessment: Patient stated that eats 1 fruit every day. Patient states she eats 1 serving of vegetables a day. Per patient states does eat 3 or more ounces of whole grains daily. Patient stated doesn't eat two or more servings of fish weekly. Patient states she does not drink more than 36 ounces or 450 calories of beverages with added sugars weekly.Patient stated she does not watch her salt intake. Marland Kitchen  Physical Activity Assessment: Patient stated that she walks for 30 to minutes every week and rarely does any vigorous exercise.  Smoking Status: Patient has never smoked and is not exposed to smoke.   Quality of Life Assessment: In assessing patient's quality of life she stated that out of the past 30 days that she has felt her health was not good for 3 days. Patient also stated that in the past 30 days that her mental health was good including stress, depression and problems with emotions for all days. Patient did state that out of the past 30 days she felt her physical or mental health had not kept her from doing her usual activities including self-care, work or recreation.   Plan: Lab work will be done today including a lipid panel, blood glucose, and Hgb A1C. Will call lab results when they  are finished. Will discuss risk reduction counseling when call results.

## 2015-06-23 NOTE — Patient Instructions (Signed)
Discussed health assessment with patient. . Let patient know that will call her with lab results and make appointment at [facility] if needed. Informed not to let [facility] do any labs or procedures until goes to eligibility and get orange card or patient assistance. Patient verbalized understanding.

## 2015-06-24 ENCOUNTER — Ambulatory Visit
Admission: RE | Admit: 2015-06-24 | Discharge: 2015-06-24 | Disposition: A | Discharge status: Home / Self Care | Payer: No Typology Code available for payment source | Source: Ambulatory Visit | Attending: Obstetrics and Gynecology | Admitting: Obstetrics and Gynecology

## 2015-06-24 DIAGNOSIS — Z1231 Encounter for screening mammogram for malignant neoplasm of breast: Secondary | ICD-10-CM

## 2015-06-28 ENCOUNTER — Telehealth: Payer: Self-pay

## 2015-06-28 NOTE — Telephone Encounter (Signed)
Called to inform about lab work from 06/23/15. Interpreter, Lavon Paganini, informed patient: cholesterol- 170, HDL- 52, LDL- 110, triglycerides - 40, Bld Glucose -81 and HBG-A1C - 5.1.Marland Kitchen Discussed BMI of 31.1 and patient stated that had been eating healthy because of previous elevated cholesterol and A1C. Therefore, patient stated that was fine and did not need further follow up at this time.

## 2015-07-18 ENCOUNTER — Encounter: Payer: Self-pay | Admitting: Obstetrics and Gynecology

## 2015-07-18 ENCOUNTER — Ambulatory Visit (INDEPENDENT_AMBULATORY_CARE_PROVIDER_SITE_OTHER): Payer: Self-pay | Admitting: Obstetrics and Gynecology

## 2015-07-18 ENCOUNTER — Other Ambulatory Visit (HOSPITAL_COMMUNITY)
Admission: RE | Admit: 2015-07-18 | Discharge: 2015-07-18 | Disposition: A | Discharge status: Home / Self Care | Payer: BLUE CROSS/BLUE SHIELD | Source: Ambulatory Visit | Attending: Obstetrics and Gynecology | Admitting: Obstetrics and Gynecology

## 2015-07-18 VITALS — BP 117/68 | HR 63 | Ht 60.0 in | Wt 161.0 lb

## 2015-07-18 DIAGNOSIS — N939 Abnormal uterine and vaginal bleeding, unspecified: Secondary | ICD-10-CM | POA: Diagnosis present

## 2015-07-18 LAB — CBC
HCT: 36.2 % (ref 36.0–46.0)
HEMOGLOBIN: 12.2 g/dL (ref 12.0–15.0)
MCH: 30.3 pg (ref 26.0–34.0)
MCHC: 33.7 g/dL (ref 30.0–36.0)
MCV: 90 fL (ref 78.0–100.0)
MPV: 9 fL (ref 8.6–12.4)
PLATELETS: 272 10*3/uL (ref 150–400)
RBC: 4.02 MIL/uL (ref 3.87–5.11)
RDW: 14.7 % (ref 11.5–15.5)
WBC: 4.9 10*3/uL (ref 4.0–10.5)

## 2015-07-18 MED ORDER — NORGESTIMATE-ETH ESTRADIOL 0.25-35 MG-MCG PO TABS: 1.0000 | ORAL_TABLET | Freq: Every day | ORAL | Status: DC

## 2015-07-18 NOTE — Progress Notes (Signed)
Patient was on iron injections & OCPs in [place]. Recently stopped OCPs. Julie [name] used for interpreter for check in

## 2015-07-18 NOTE — Progress Notes (Signed)
Patient ID: Julie [name], female   DOB: 07-Oct-1965, 50 y.o.   MRN: [id number] 50 yo G4P4 here for the evaluation of abnormal uterine bleeding. Patient reports onset of abnormal vaginal bleeding approximately 2 years ago. She reports monthly cycles lasting 3-5 days. Over the past 2 years, her cycles have become prolonged and heavy in flow often lasting 2 weeks. She was seen by [facility] in 2015 with a negative endometrial biopsy and diagnosed with a fibroid uterus. She was medically managed with provera. She later went to [place] who treated her with OCP until now. She reports significant improvement in her vaginal bleeding which are now occurrying monthly. She is concerned about the fibroids and would like to know if they have grown any further.  Past Medical History  Diagnosis Date  . Gallstones   . Nausea & vomiting   . Diarrhea   . Bloated abdomen     post surgery   . Anemia    Past Surgical History  Procedure Laterality Date  . Cesarean section    . Laparoscopic cholecystectomy w/ cholangiography  01/26/11  . Tubal ligation    . Cholecystectomy     Family History  Problem Relation Age of Onset  . Cancer Mother     cervical  . Hypertension Mother    Social History  Substance Use Topics  . Smoking status: Never Smoker   . Smokeless tobacco: Never Used  . Alcohol Use: No     Comment: occ   ROS See pertinent in HPI  Blood pressure 117/68, pulse 63, height 5' (1.524 m), weight 161 lb (73.029 kg), last menstrual period 06/25/2015. GENERAL: Well-developed, well-nourished female in no acute distress.  ABDOMEN: Soft, nontender, nondistended. No organomegaly. PELVIC: Normal external female genitalia. Vagina is pink and rugated.  Normal discharge. Normal appearing cervix. Uterus is18 weeks in size. No adnexal mass or tenderness. EXTREMITIES: No cyanosis, clubbing, or edema, 2+ distal pulses.  A/P 50 yo with abnormal uterine bleeding  - Will schedule ultrasound - discussed the need  to perform endometrial biopsy ENDOMETRIAL BIOPSY     The indications for endometrial biopsy were reviewed.   Risks of the biopsy including cramping, bleeding, infection, uterine perforation, inadequate specimen and need for additional procedures  were discussed. The patient states she understands and agrees to undergo procedure today. Consent was signed. Time out was performed. Urine HCG was negative. A sterile speculum was placed in the patient's vagina and the cervix was prepped with Betadine. A single-toothed tenaculum was placed on the anterior lip of the cervix to stabilize it. The uterine cavity was sounded to a depth of 12 cm using the uterine sound. The 3 mm pipelle was introduced into the endometrial cavity without difficulty, 2 passes were made.  A  moderate amount of tissue was  sent to pathology. The instruments were removed from the patient's vagina. Minimal bleeding from the cervix was noted. The patient tolerated the procedure well.  Routine post-procedure instructions were given to the patient. The patient will follow up in two weeks to review the results and for further management.  - Discussed continued medical management with contraception as it has been working for her. Rx Sprintec provided - Also discussed surgical management if the OCP fails. - Patient will be contacted with any abnormal results - RTC prn

## 2015-07-20 ENCOUNTER — Telehealth: Payer: Self-pay | Admitting: General Practice

## 2015-07-20 NOTE — Telephone Encounter (Signed)
Per Dr Elly Modena, patient's endo bx was negative & is no longer anemic. Called patient with pacific interpreter [phone], no answer- left message for patient to call us back for non urgent results

## 2015-07-21 ENCOUNTER — Encounter: Payer: Self-pay | Admitting: *Deleted

## 2015-07-21 ENCOUNTER — Ambulatory Visit (HOSPITAL_COMMUNITY)
Admission: RE | Admit: 2015-07-21 | Discharge: 2015-07-21 | Disposition: A | Discharge status: Home / Self Care | Payer: BLUE CROSS/BLUE SHIELD | Source: Ambulatory Visit | Attending: Obstetrics and Gynecology | Admitting: Obstetrics and Gynecology

## 2015-07-21 DIAGNOSIS — N939 Abnormal uterine and vaginal bleeding, unspecified: Secondary | ICD-10-CM | POA: Insufficient documentation

## 2015-07-21 DIAGNOSIS — D252 Subserosal leiomyoma of uterus: Secondary | ICD-10-CM | POA: Insufficient documentation

## 2015-07-21 NOTE — Telephone Encounter (Addendum)
Called Julie [name] with Interpreter Zenda Alpers. Left message we are calling with some information. Please call clinic. Will send certified letter.

## 2015-07-25 ENCOUNTER — Telehealth: Payer: Self-pay

## 2015-07-25 NOTE — Telephone Encounter (Signed)
Per Dr. Elly Modena, pt needs to be informed of [place] results; showed large fibroid and advises her to continue to use [facility] for management as discussed or if she decides to discuss surgery options.  Called pt with Spanish interpreter, Lanice Shirts, and LM to return call to the Clinics.

## 2015-07-26 NOTE — Telephone Encounter (Signed)
Called patient with pacific interpreter [phone] and informed patient of results and recommendations. Patient verbalized understanding and asked if she needed to be on iron. Told patient no because her levels were normal. Patient verbalized understanding and asked about the mirena as an option instead of birth control. Told patient that may be a possibility but she needs to come in an fill out the financial application as that option and surgery are costly without insurance. Patient verbalized understanding and call was disconnected

## 2015-08-04 ENCOUNTER — Ambulatory Visit: Payer: No Typology Code available for payment source | Admitting: Obstetrics and Gynecology

## 2015-08-23 ENCOUNTER — Telehealth: Payer: Self-pay

## 2015-08-23 NOTE — Telephone Encounter (Signed)
When called per Julie [name] patient decided that wanted to come in and discuss BMI and what can do for it. She knows that needs to lose weight. Patient need more assistance in person.

## 2015-08-26 ENCOUNTER — Encounter: Payer: Self-pay | Admitting: Obstetrics and Gynecology

## 2015-08-26 ENCOUNTER — Ambulatory Visit (INDEPENDENT_AMBULATORY_CARE_PROVIDER_SITE_OTHER): Payer: BLUE CROSS/BLUE SHIELD | Admitting: Obstetrics and Gynecology

## 2015-08-26 VITALS — BP 109/65 | HR 67 | Ht 60.0 in | Wt 159.1 lb

## 2015-08-26 DIAGNOSIS — D259 Leiomyoma of uterus, unspecified: Secondary | ICD-10-CM | POA: Diagnosis not present

## 2015-08-26 DIAGNOSIS — Z09 Encounter for follow-up examination after completed treatment for conditions other than malignant neoplasm: Secondary | ICD-10-CM

## 2015-08-26 NOTE — Progress Notes (Signed)
Patient ID: Julie [name], female   DOB: [birth year], 50 y.o.   MRN: [id number] 50 yo G4P4 presenting today for follow up on DUB medically managed with OCP. Patient reports feeling well. Her DUB has resolved following completion og her first OCP pack. She expresses concerns regarding increased headaches and varicose veins in her legs. She works from [place] at [employer] on her feet all day. She admits to not always eating well and staying hydrated  Past Medical History  Diagnosis Date  . Gallstones   . Nausea & vomiting   . Diarrhea   . Bloated abdomen     post surgery   . Anemia    Past Surgical History  Procedure Laterality Date  . Cesarean section    . Laparoscopic cholecystectomy w/ cholangiography  01/26/11  . Tubal ligation    . Cholecystectomy       Family History  Problem Relation Age of Onset  . Cancer Mother     cervical  . Hypertension Mother    Social History  Substance Use Topics  . Smoking status: Never Smoker   . Smokeless tobacco: Never Used  . Alcohol Use: No     Comment: occ   ROS See pertinent in HPI  Blood pressure 109/65, pulse 67, height 5' (1.524 m), weight 159 lb 1.6 oz (72.167 kg).  GENERAL: Well-developed, well-nourished female in no acute distress.  HEENT: Normocephalic, atraumatic. Sclerae anicteric.  ABDOMEN: Soft, nontender, nondistended.  PELVIC: Not performed EXTREMITIES: No cyanosis, clubbing, or edema, 2+ distal pulses. Non-tender varicosities in both ankles  A/P 50 yo with fibroid uterus and DUB - Continue medical management with OCP - Advised to increase water intake - Advised to use supportive stockings. Patient admits that they were prescribed to her 2 years ago but she has not bought them. Also encouraged the patient to elevate her legs when sitting on laying  - RTC prn

## 2015-09-09 ENCOUNTER — Ambulatory Visit: Payer: BLUE CROSS/BLUE SHIELD

## 2015-09-09 VITALS — Ht 60.0 in | Wt 159.9 lb

## 2015-09-09 DIAGNOSIS — Z789 Other specified health status: Secondary | ICD-10-CM

## 2015-09-09 NOTE — Progress Notes (Signed)
[place] Patient returns today for Health Coaching. Interpreter Anastasio Auerbach present. Patient coming mainly because want to lose weight because all lab value were normal.  HEALTH COACHING:  Patient and I went over lab results. Patient stated that she had been cutting back since we called. When asked her to pick pictures of food that eats for main meal, it included 4 tortillas, 1 cup rice, 1 cup beans. Explained to patient that that was 8 servings of starches and is only allowed 6 or 7 servings. Went over handouts about what weight should be and her BMI. Looked and discussed food groups, no of portions can have in each group per day. Patient could not believe that a serving of avocado was only one eighth of an avocado. Patient stated that had been eating a half of an avocado. Explained that has good oil but a lot of calories.We went over [employer] and received a plate. With measuring cup went over serving sizes. Discussed reading food labels and received handouts in Spanish.  Patient talked about left arm hurting. Stated that she was walking and had gone in past to a gym. Will try Ibuprofen or aleve to see if helps arm pain.  PLAN: Will Call in 3 to 4 weeks. Will decrease starches.Will loose weight.

## 2015-09-09 NOTE — Patient Instructions (Signed)
Will measure food. Will cut down on portion sizes. Will be more alert to labels on food.

## 2015-10-27 ENCOUNTER — Telehealth: Payer: Self-pay

## 2015-10-27 NOTE — Telephone Encounter (Signed)
[place] Patient called per phone today for Health Coaching with interpreter Lavon Paganini present. Patient's areas of concern were weight, LDL and exercise.  HEALTH COACHING: Patient stated had decreased the amount of bread, rice, and grease she is eating. Per patient has increased the amount of vegetables that she is eating. Patient stated that is not eating at night. States that has not weighed self but feels is losing weight and people are asking what she is doing. Patient stated that is exercising 40 minutes on most days. Per patient all of this has helped to decrease her depression. Patient states is felling much better.  PLAN: Will Call in 3 to 4 weeks. Will continue with increasing exercise. Will continue with the good meal and nutrition changes. Will be doing follow up assessment when call next.

## 2015-11-14 ENCOUNTER — Telehealth: Payer: Self-pay

## 2015-11-14 NOTE — Telephone Encounter (Signed)
Called for follow up assessment for [facility] and left message to return call.

## 2016-01-06 ENCOUNTER — Encounter: Payer: Self-pay | Admitting: Student in an Organized Health Care Education/Training Program

## 2016-01-06 ENCOUNTER — Ambulatory Visit (INDEPENDENT_AMBULATORY_CARE_PROVIDER_SITE_OTHER): Payer: Self-pay | Admitting: Student in an Organized Health Care Education/Training Program

## 2016-01-06 VITALS — BP 113/50 | HR 62 | Temp 97.9°F | Ht 60.21 in | Wt 157.0 lb

## 2016-01-06 DIAGNOSIS — D500 Iron deficiency anemia secondary to blood loss (chronic): Secondary | ICD-10-CM | POA: Insufficient documentation

## 2016-01-06 LAB — POCT HEMOGLOBIN: HEMOGLOBIN: 9.6 g/dL — AB (ref 12.2–16.2)

## 2016-01-06 MED ORDER — FERROUS SULFATE 325 (65 FE) MG PO TABS: 325.0000 mg | ORAL_TABLET | Freq: Every day | ORAL | Status: DC

## 2016-01-06 NOTE — Progress Notes (Signed)
   CC: Anemia   HPI: Julie [name] is a 49 y.o. female who presents to [facility] today for follow-up of anemia on labs from an outside institution.  The patient endorses a history of abnormal uterine bleeding of 3 years duration which was previously found to be secondary to uterine fibroids and improved with OTC's.  Today, She brings in lab reports from her physician in [place] with a Hgb of 5.9 from 12/12/2015 (one month ago).  The patient endorses AUB daily with use of an average of 2-5 completely saturated pads per day over the past several weeks. She endorses starting ferrous sulfate 325 mg/day one month ago.  Patient denies any symptoms of anemia. She denies fatigue, muscle cramps, light-headedness, blurry vision, palpitations or shortness of breath.  She denies any recent falls. She denies abdominal pain. No hematuria or pain with urination. Denies hematochezia or melena.   Review of Symptoms: See HPI for ROS. All other systems reviewed and are negative.  Of note, no fever, chills, N/V/D.  CC, SH/smoking status, and VS noted.  Objective: BP 113/50 mmHg  Pulse 62  Temp(Src) 97.9 F (36.6 C) (Oral)  Ht 5' 0.21" (1.529 m)  Wt 157 lb (71.215 kg)  BMI 30.46 kg/m2  LMP 01/02/2016 GEN: NAD, alert, cooperative, and pleasant. EYE: no conjunctival palor, no conjunctival  injection, pupils equally round and reactive to light ENMT: mucous membranes moist, no mucosal palor RESPIRATORY: clear to auscultation bilaterally with no wheezes rhonchi or rales, good effort CV: RRR, no m/r/g no peripheral edema GI: soft, non-tender, non-distended with normoactive bowel sounds, no hepatosplenomegaly SKIN: warm and dry, no rashes, no lesions PSYCH: AAOx3, appropriate affect  Assessment and plan:  Anemia due to chronic blood loss Secondary to chronic AUB from uterine fibroids.  Patient is asymptomatic. - Concern for Hgb of 5.9 on lab report from [place]. POCT HgB 9.6 today, improved.   - Continue OTC  medication per OB/GYN recommendations, and encouraged patient to follow up with OB/GYN. Last GYN note in 06/2015 suggested surgical intervention would be considered in the event that OTC therapy failed.  - Continue Ferrous Sulfate 325 mg/day - Recommended OTC stool softeners for constipation side effect  - Pt counseled on warning signs of severe anemia, advised to call or come into emergency room if develops fatigue, muscle cramps, positional light headednes or if bleeding worsens.    Orders Placed This Encounter  Procedures  . POCT hemoglobin    Meds ordered this encounter  Medications  . ferrous sulfate (FERROUSUL) 325 (65 FE) MG tablet    Sig: Take 1 tablet (325 mg total) by mouth daily with breakfast.    Dispense:  60 tablet    Refill:  3     Everrett Coombe, MD,MS,  PGY1 01/06/2016 3:24 PM

## 2016-01-06 NOTE — Assessment & Plan Note (Addendum)
Secondary to chronic AUB from uterine fibroids.  Patient is asymptomatic. - Concern for Hgb of 5.9 on lab report from [place]. POCT HgB 9.6 today, improved.   - Continue OTC medication per OB/GYN recommendations, and encouraged patient to follow up with OB/GYN. Last GYN note in 06/2015 suggested surgical intervention would be considered in the event that OTC therapy failed.  - Continue Ferrous Sulfate 325 mg/day - Recommended OTC stool softeners for constipation side effect  - Pt counseled on warning signs of severe anemia, advised to call or come into emergency room if develops fatigue, muscle cramps, positional light headednes or if bleeding worsens.

## 2016-01-06 NOTE — Patient Instructions (Addendum)
It was a pleasure seeing you today in our clinic. Today we discussed anemia due to blood loss from your uterine fibroids. Here is the treatment plan we have discussed and agreed upon together:  - Hemoglobin today was 9.6, which is low, but improved from your previous test in [place] which indicated hemoglobin was 5.9. - Continue to take iron pills and birth control pills as prescribed by OBGYN - Continue to monitor for warning signs from low blood count, which I printed below as a reminder. - follow up with OB/GYN for management of fibroids.    Our clinic's number is [phone]. Feel free to call any time with questions or concerns.   - Dr. 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 Con frecuencia, la causa es una cantidad insuficiente de hierro. Esto se denomina anemia por deficiencia de hierro y puede provocarle cansancio y dificultad para respirar. [place] vitaminas segn las indicaciones del mdico.  Consuma alimentos que contengan hierro. Estos incluyen hgado, carne magra, pan de grano integral, huevos, frutas deshidratadas y vegetales de hojas color verde oscuro. SOLICITE AYUDA DE INMEDIATO SI:  Pierde el conocimiento (se desmaya).  Siente dolor en el pecho.  Tiene malestar estomacal (nuseas) o vomita.  Tiene mucha dificultad para respirar cuando realiza actividades.  Se siente dbil.  La frecuencia cardaca est acelerada.  Comienza a sudar sin motivo.  Se siente mareado cuando se levanta de una silla o de la cama. ASEGRESE DE QUE:  Comprende estas instrucciones.  Controlar su afeccin.  Recibir ayuda de inmediato si no mejora o si empeora.   Esta informacin no tiene Marine scientist el consejo del mdico. Asegrese de hacerle al mdico cualquier pregunta que tenga.     Document Released: 09/15/2010 Document Revised: 10/26/2014 Elsevier Interactive Patient Education [employer].

## 2016-02-09 ENCOUNTER — Ambulatory Visit: Payer: BLUE CROSS/BLUE SHIELD

## 2016-02-22 ENCOUNTER — Ambulatory Visit: Payer: BLUE CROSS/BLUE SHIELD | Attending: Internal Medicine

## 2016-04-27 ENCOUNTER — Ambulatory Visit: Payer: Self-pay

## 2016-05-04 ENCOUNTER — Ambulatory Visit (HOSPITAL_COMMUNITY)
Admission: RE | Admit: 2016-05-04 | Discharge: 2016-05-04 | Disposition: A | Discharge status: Home / Self Care | Payer: Self-pay | Source: Ambulatory Visit | Attending: Family Medicine | Admitting: Family Medicine

## 2016-05-04 ENCOUNTER — Other Ambulatory Visit: Payer: Self-pay

## 2016-05-04 ENCOUNTER — Encounter: Payer: Self-pay | Admitting: Internal Medicine

## 2016-05-04 ENCOUNTER — Ambulatory Visit (INDEPENDENT_AMBULATORY_CARE_PROVIDER_SITE_OTHER): Payer: Self-pay | Admitting: Internal Medicine

## 2016-05-04 DIAGNOSIS — Z029 Encounter for administrative examinations, unspecified: Secondary | ICD-10-CM | POA: Insufficient documentation

## 2016-05-04 DIAGNOSIS — R0789 Other chest pain: Secondary | ICD-10-CM

## 2016-05-04 DIAGNOSIS — R079 Chest pain, unspecified: Secondary | ICD-10-CM | POA: Insufficient documentation

## 2016-05-04 DIAGNOSIS — F32A Depression, unspecified: Secondary | ICD-10-CM | POA: Insufficient documentation

## 2016-05-04 DIAGNOSIS — F329 Major depressive disorder, single episode, unspecified: Secondary | ICD-10-CM

## 2016-05-04 NOTE — Patient Instructions (Addendum)
It was nice meeting you today Ms. Julie [name]!  Your worsening chest pain seems most likely related to the cough you have had recently. It seems more like muscle pain than pain related to your heart. Your EKG did not show any abnormalities or damage to your heart.   If you experience the following symptoms, please go to the emergency room: - Worsening chest pain that does not improve  - Chest pain with difficulty breathing - Chest pain that radiates (to your arm, your back, your side)   If you have any questions or concerns, please feel free to call the clinic.   Be well,  Dr. Avon Gully

## 2016-05-04 NOTE — Progress Notes (Addendum)
   Subjective:    Patient ID: Julie [name], female    DOB: [birth year], 50 y.o.   MRN: [id number]  HPI  Patient presents for same day appointment for L-sided chest pain.  Ongoing for years, but worse in past 5 days. Began to worsen after patient had the "flu," which she describes as developing congestion and a cough. Says pain is worse at night but can happen at any time of day. Pain does not radiate. Pain improves after she rubs "back rub" on her L chest and drinks a glass of water.  She does have some difficulty breathing with the chest pain, but reports that she has worse difficulty breathing now due to nasal congestion. Reports a headache once yesterday and cough productive of phlegm but denies any other symptoms. No cardiac history. Denies chest pain currently. Denies lower extremity edema.   Smoking status reviewed.   Review of Systems See HPI.     Objective:   Physical Exam  Constitutional: She is oriented to person, place, and time. She appears well-developed and well-nourished. No distress.  HENT:  Head: Normocephalic and atraumatic.  Eyes: Conjunctivae and EOM are normal. Right eye exhibits no discharge. Left eye exhibits no discharge. No scleral icterus.  Cardiovascular: Normal rate, regular rhythm and normal heart sounds.   No murmur heard. Pulmonary/Chest: Effort normal and breath sounds normal. No respiratory distress. She has no wheezes.  Chest pain reproducible on palpation of L chest wall.   Musculoskeletal: She exhibits no edema.  Neurological: She is alert and oriented to person, place, and time.  Skin: Skin is warm and dry.  Psychiatric: She has a normal mood and affect. Her behavior is normal.      Assessment & Plan:  Chest pain Most consistent with MSK chest wall pain, as pain is reproducible on exam and improves at home with muscle rub. Likely 2/2 to preceding and ongoing cough. Reported SOB seems more likely due to nasal congestion than associated  with chest pain. Lungs CTAB, no SOB, and patient not tachycardic, so PE less likely cause of pain. EKG NSR making MI less likely. As chest wall pain most likely cause given physical exam, ROS, and EKG, no further work-up indicated at this time.  - Patient given strict return precautions - If chronic chest pain continues, could consider further work-up  Depression Patient with high score on PHQ9, including reporting occasional thoughts of harming herself. Patient denies suicidal ideation currently or any time today, though did become tearful when discussing this.  - As today's appointment was same day and patient was not seen until after 5PM, discussed with patient that we will call her on Monday with either a [place] appointment or with a regular office appointment if she cannot be seen by [facility]  Adin Hector, MD, MPH PGY-2 [facility] Pager [phone]

## 2016-05-04 NOTE — Assessment & Plan Note (Signed)
Most consistent with MSK chest wall pain, as pain is reproducible on exam and improves at home with muscle rub. Likely 2/2 to preceding and ongoing cough. Reported SOB seems more likely due to nasal congestion than associated with chest pain. Lungs CTAB, no SOB, and patient not tachycardic, so PE less likely cause of pain. EKG NSR making MI less likely. As chest wall pain most likely cause given physical exam, ROS, and EKG, no further work-up indicated at this time.  - Patient given strict return precautions - If chronic chest pain continues, could consider further work-up

## 2016-05-04 NOTE — Assessment & Plan Note (Signed)
Patient with high score on PHQ9, including reporting occasional thoughts of harming herself. Patient denies suicidal ideation currently or any time today, though did become tearful when discussing this.  - As today's appointment was same day and patient was not seen until after 5PM, discussed with patient that we will call her on Monday with either a [place] appointment or with a regular office appointment if she cannot be seen by [facility]

## 2016-05-07 ENCOUNTER — Telehealth: Payer: Self-pay | Admitting: Psychology

## 2016-05-07 NOTE — Telephone Encounter (Signed)
[facility] called patient to check in about depressive symptoms.  Background: Patient had come in for appointment with physician on Friday 05/04/16 and was seen after 5PM. Patient filled out PHQ-9 and endorsed mainly, feeling tired often, low energy, and feeling bad about herself. She also endorsed occasional passive suicidal thoughts. Lauren discussed with the patient and asked if she'd be open to [facility] phone call, patient said yes.   Call: [name] used interpreter service (interpreter: Lavella Lemons, [place][phone]) to leave a message letting the patient know to call the front office to schedule an appointment with behavioral health. [facility] will plan to call again in a week if no response.

## 2016-05-08 ENCOUNTER — Telehealth: Payer: Self-pay

## 2016-05-08 NOTE — Telephone Encounter (Signed)
[facility] spoke with patient and scheduled an appointment with me Sonia Baller) in behavioral health for next Tuesday at 3:30pm. Patient does not drive but will try to get transportation. States she will call 2 days beforehand if unable to attend.

## 2016-05-08 NOTE — Telephone Encounter (Signed)
[facility] called to follow up regarding last Friday's visit. Left message letting patient know that I will call her again this afternoon.

## 2016-05-15 ENCOUNTER — Ambulatory Visit (INDEPENDENT_AMBULATORY_CARE_PROVIDER_SITE_OTHER): Payer: Self-pay | Admitting: Psychology

## 2016-05-15 DIAGNOSIS — F32A Depression, unspecified: Secondary | ICD-10-CM

## 2016-05-15 DIAGNOSIS — F329 Major depressive disorder, single episode, unspecified: Secondary | ICD-10-CM

## 2016-05-15 NOTE — Assessment & Plan Note (Signed)
Assessment/Plan/Recommendations: Patient's symptoms appear to have onset after her mother died 3 years ago. She explained that her higher PHQ-9 score at a recent appointment was due to stressors on that day. She scored a 12 today. Patient does not feel that she needs additional behavioral health followup at this time, but was told that she can call to schedule additional appointments if she would like.

## 2016-05-15 NOTE — Progress Notes (Signed)
Reason for follow-up:  Provider concern for patient's PHQ-9 score at most recent visit to clinic. This appointment occurred when no [facility] was available  Issues discussed:  Patient discussed her current presentation of and history of depressive symptoms. She cries often, especially when seeing others cry. She stated that when she is out or busy, she feels happy and enjoys herself, but when she is alone or at home, she is more irritable and sometimes cries. Patient explained that her symptoms came on after her mother's death about 3 years ago. She stated that the first year was very hard and that she cried daily, and that now that has improved somewhat. She states that at her most recent doctor's appointment, she was under a lot of stress, having just spoken with her daughter in [place] who is having marital difficulties, and having just had an argument with her husband. She stated that these events influenced how she filled out the PHQ-9 and that she was thinking that it would be easier if she was just gone. She described this as a fleeting thought and denied SI today. She stated that she has never thought about killing herself or hurting herself.   Patient feels that she mostly has her symptoms under control and is not concerned for her wellbeing. She opted to learn deep breathing today to help her when she feels like she might cry and to help her calm down when she feels irritable at home around her husband or children.

## 2016-05-16 ENCOUNTER — Encounter (HOSPITAL_COMMUNITY): Payer: Self-pay

## 2016-05-16 ENCOUNTER — Ambulatory Visit (INDEPENDENT_AMBULATORY_CARE_PROVIDER_SITE_OTHER): Payer: Self-pay | Admitting: Obstetrics and Gynecology

## 2016-05-16 VITALS — BP 119/63 | HR 59 | Wt 162.0 lb

## 2016-05-16 DIAGNOSIS — D259 Leiomyoma of uterus, unspecified: Secondary | ICD-10-CM

## 2016-05-16 DIAGNOSIS — Z1151 Encounter for screening for human papillomavirus (HPV): Secondary | ICD-10-CM

## 2016-05-16 DIAGNOSIS — N938 Other specified abnormal uterine and vaginal bleeding: Secondary | ICD-10-CM

## 2016-05-16 DIAGNOSIS — Z124 Encounter for screening for malignant neoplasm of cervix: Secondary | ICD-10-CM

## 2016-05-16 LAB — CBC
HCT: 36.4 % (ref 35.0–45.0)
HEMOGLOBIN: 11.9 g/dL (ref 11.7–15.5)
MCH: 29.6 pg (ref 27.0–33.0)
MCHC: 32.7 g/dL (ref 32.0–36.0)
MCV: 90.5 fL (ref 80.0–100.0)
MPV: 9 fL (ref 7.5–12.5)
PLATELETS: 397 10*3/uL (ref 140–400)
RBC: 4.02 MIL/uL (ref 3.80–5.10)
RDW: 13.9 % (ref 11.0–15.0)
WBC: 5 10*3/uL (ref 3.8–10.8)

## 2016-05-16 NOTE — Progress Notes (Signed)
Spanish Interpreter Raquel Mora  Pt declined referral to [facility] stating that "she will get it looked at in [place]".

## 2016-05-16 NOTE — Progress Notes (Signed)
50 yo G4P4 with DUB and fibroid uterus presenting today requesting a hysterectomy. Patient reports continued use of OCP to manage her period. However, she experiences 12 days of vaginal bleeding with passage of clots. She reports being seen in [place] in June and informed of a hemoglobin of 5 following a syncopal episode. She declined the blood transfusion but treated her anemia with iron supplements. Patient denies chest pain, SOB, lightheadedness/dizziness.  Past Medical History:  Diagnosis Date  . Anemia   . Bloated abdomen    post surgery   . Diarrhea   . Gallstones   . Nausea & vomiting    Past Surgical History:  Procedure Laterality Date  . CESAREAN SECTION    . CHOLECYSTECTOMY    . LAPAROSCOPIC CHOLECYSTECTOMY W/ CHOLANGIOGRAPHY  01/26/11  . TUBAL LIGATION     Family History  Problem Relation Age of Onset  . Cancer Mother     cervical  . Hypertension Mother    ROS See pertinent in HPI  Blood pressure 119/63, pulse (!) 59, weight 162 lb (73.5 kg). GENERAL: Well-developed, well-nourished female in no acute distress.  ABDOMEN: Soft, nontender, nondistended. No organomegaly. PELVIC: Normal external female genitalia. Vagina is pink and rugated.  Normal discharge. Normal appearing cervix. Uterus is 16-18 weeks in size. No adnexal mass or tenderness. EXTREMITIES: No cyanosis, clubbing, or edema, 2+ distal pulses.  - Negative endometrial biopsy in January  A/P 50 yo with DUB and anemia secondary to fibroid uterus - pap smear collected - Risks benefits and alternatives were reviewed with the patient including but not limited to risks of bleeding infection and damage to adjacent organs. Patient verbalized understanding and all questions were answered - Patient will be scheduled for a TAH with bilateral salpingectomy. She desires the procedure to be done in January - Rachel, [place] interpreter present for the encounter

## 2016-05-23 LAB — CYTOLOGY - PAP
Diagnosis: NEGATIVE
HPV: NOT DETECTED

## 2016-06-05 ENCOUNTER — Other Ambulatory Visit: Payer: Self-pay | Admitting: Obstetrics and Gynecology

## 2016-06-05 DIAGNOSIS — Z1231 Encounter for screening mammogram for malignant neoplasm of breast: Secondary | ICD-10-CM

## 2016-06-20 ENCOUNTER — Encounter (HOSPITAL_COMMUNITY): Payer: Self-pay | Admitting: *Deleted

## 2016-06-28 ENCOUNTER — Encounter (HOSPITAL_COMMUNITY): Payer: Self-pay

## 2016-06-28 ENCOUNTER — Telehealth: Payer: Self-pay | Admitting: *Deleted

## 2016-06-28 ENCOUNTER — Ambulatory Visit (HOSPITAL_COMMUNITY)
Admission: RE | Admit: 2016-06-28 | Discharge: 2016-06-28 | Disposition: A | Discharge status: Home / Self Care | Payer: No Typology Code available for payment source | Source: Ambulatory Visit | Attending: Obstetrics and Gynecology | Admitting: Obstetrics and Gynecology

## 2016-06-28 ENCOUNTER — Ambulatory Visit
Admission: RE | Admit: 2016-06-28 | Discharge: 2016-06-28 | Disposition: A | Discharge status: Home / Self Care | Payer: No Typology Code available for payment source | Source: Ambulatory Visit | Attending: Obstetrics and Gynecology | Admitting: Obstetrics and Gynecology

## 2016-06-28 ENCOUNTER — Other Ambulatory Visit: Payer: Self-pay | Admitting: *Deleted

## 2016-06-28 VITALS — BP 136/84 | Temp 98.1°F | Ht 61.0 in | Wt 166.4 lb

## 2016-06-28 DIAGNOSIS — N939 Abnormal uterine and vaginal bleeding, unspecified: Secondary | ICD-10-CM

## 2016-06-28 DIAGNOSIS — Z1231 Encounter for screening mammogram for malignant neoplasm of breast: Secondary | ICD-10-CM

## 2016-06-28 DIAGNOSIS — Z1239 Encounter for other screening for malignant neoplasm of breast: Secondary | ICD-10-CM

## 2016-06-28 MED ORDER — NORGESTIMATE-ETH ESTRADIOL 0.25-35 MG-MCG PO TABS: 1.0000 | ORAL_TABLET | Freq: Every day | ORAL | 11 refills | Status: DC

## 2016-06-28 NOTE — Telephone Encounter (Signed)
Called patient and notified her of a prescription for sprintec which is waiting for her in the front office. Understanding voiced. Spanish interpreter utilized for call.

## 2016-06-28 NOTE — Progress Notes (Signed)
No complaints today.   Pap Smear: Pap smear not completed today. Last Pap smear was 05/16/2016 at [facility] and normal with negative HPV. Per patient has a history of an abnormal Pap smear 10 years ago that a LEEP was completed for follow up. Patient stated she is scheduled to have a hysterectomy later this month due to AUB. Last two Pap smear results are in EPIC.  Physical exam: Breasts Breasts symmetrical. No skin abnormalities bilateral breasts. No nipple retraction bilateral breasts. No nipple discharge bilateral breasts. No lymphadenopathy. No lumps palpated bilateral breasts. No complaints of pain or tenderness on exam. Referred patient to the [facility] for a screening mammogram. Appointment scheduled for Thursday, June 28, 2016 at 0830.        Pelvic/Bimanual No Pap smear completed today since last Pap smear and HPV typing was completed 05/16/2016. Pap smear not indicated per BCCCP guidelines.   Smoking History: Patient has never smoked.  Patient Navigation: Patient education provided. Access to services provided for patient through [facility] program. Spanish interpreter provided.  Colorectal Cancer Screening: Per patient has never had a colonoscopy completed. No complaints today.  Used Spanish interpreter [employer] from [place].

## 2016-06-28 NOTE — Patient Instructions (Signed)
Explained breast self awareness to [employer]. Patient did not need a Pap smear today due to last Pap smear and HPV typing was 05/16/2016. Let her know BCCCP will cover Pap smears and HPV typing every 5 years unless has a history of abnormal Pap smears. Referred patient to the [facility] for a screening mammogram. Appointment scheduled for Thursday, June 28, 2016 at 0830. Let patient know the [facility] will follow up with her within the next couple weeks with results of mammogram by letter or phone. Alba verbalized understanding.  Cloe Sockwell, Arvil Chaco, RN 10:24 AM

## 2016-06-28 NOTE — Addendum Note (Signed)
Encounter addended by: Loletta Parish, RN on: 06/28/2016  1:09 PM<BR>    Actions taken: Charge Capture section accepted

## 2016-06-29 ENCOUNTER — Encounter (HOSPITAL_COMMUNITY): Payer: Self-pay | Admitting: *Deleted

## 2016-07-03 ENCOUNTER — Encounter (HOSPITAL_COMMUNITY): Admission: RE | Payer: Self-pay | Source: Ambulatory Visit

## 2016-07-03 ENCOUNTER — Ambulatory Visit (HOSPITAL_COMMUNITY): Admission: RE | Admit: 2016-07-03 | Payer: Self-pay | Source: Ambulatory Visit | Admitting: Obstetrics and Gynecology

## 2016-07-03 ENCOUNTER — Other Ambulatory Visit: Payer: Self-pay | Admitting: Obstetrics and Gynecology

## 2016-07-03 DIAGNOSIS — N939 Abnormal uterine and vaginal bleeding, unspecified: Secondary | ICD-10-CM

## 2016-07-03 SURGERY — HYSTERECTOMY, TOTAL, ABDOMINAL, WITH SALPINGECTOMY
Anesthesia: General

## 2016-08-19 ENCOUNTER — Other Ambulatory Visit: Payer: Self-pay | Admitting: Student in an Organized Health Care Education/Training Program

## 2016-08-19 DIAGNOSIS — D500 Iron deficiency anemia secondary to blood loss (chronic): Secondary | ICD-10-CM

## 2016-08-28 ENCOUNTER — Encounter: Payer: Self-pay | Admitting: Obstetrics and Gynecology

## 2016-08-28 ENCOUNTER — Telehealth: Payer: Self-pay | Admitting: Obstetrics and Gynecology

## 2016-08-28 NOTE — Telephone Encounter (Signed)
Called all numbers on patient's profile. Was unsuccessful reaching patient. Left message on both numbers requesting call back as soon as possible, to see if patient is still interested on having surgery. If so will schedule consultation with Dr. Elly Modena.

## 2016-09-18 ENCOUNTER — Encounter: Payer: Self-pay | Admitting: Family Medicine

## 2017-01-22 ENCOUNTER — Ambulatory Visit (INDEPENDENT_AMBULATORY_CARE_PROVIDER_SITE_OTHER): Payer: Self-pay | Admitting: Obstetrics and Gynecology

## 2017-01-22 ENCOUNTER — Encounter: Payer: Self-pay | Admitting: Obstetrics and Gynecology

## 2017-01-22 VITALS — BP 133/70 | HR 68 | Wt 175.0 lb

## 2017-01-22 DIAGNOSIS — D259 Leiomyoma of uterus, unspecified: Secondary | ICD-10-CM

## 2017-01-22 NOTE — Progress Notes (Signed)
[employer] interpreter [phone]

## 2017-01-22 NOTE — Progress Notes (Signed)
51 yo G4P4 with here for surgical consultation. Patient with known fibroid uterus. She missed 2 previously scheduled surgery dates due to financial constraints. She is here ready for definitive intervention. She reports 8-10 day periods with OCP monthly. She reports some dysmenorrhea  Past Medical History:  Diagnosis Date  . Anemia   . Bloated abdomen    post surgery   . Diarrhea   . Gallstones   . Nausea & vomiting    Past Surgical History:  Procedure Laterality Date  . CESAREAN SECTION    . CHOLECYSTECTOMY    . LAPAROSCOPIC CHOLECYSTECTOMY W/ CHOLANGIOGRAPHY  01/26/11  . TUBAL LIGATION     Family History  Problem Relation Age of Onset  . Cancer Mother        cervical  . Hypertension Mother    Social History  Substance Use Topics  . Smoking status: Never Smoker  . Smokeless tobacco: Never Used  . Alcohol use Yes     Comment: occ   ROS See pertinent in HPI Blood pressure 133/70, pulse 68, weight 175 lb (79.4 kg), last menstrual period 01/07/2017.  GENERAL: Well-developed, well-nourished female in no acute distress.  LUNGS: Clear to auscultation bilaterally.  HEART: Regular rate and rhythm. ABDOMEN: Soft, nontender, nondistended.  PELVIC: Normal external female genitalia. Vagina is pink and rugated.  Normal discharge. Normal appearing cervix. Uterus is 16 weeks in size, non mobile and bulky. No adnexal mass or tenderness. EXTREMITIES: No cyanosis, clubbing, or edema, 2+ distal pulses.  A/P 51 yo here for surgical consultation for symptomatic fibroid uterus - Discussed plan for total abdominal hysterectomy with salpingectomy. Risks, benefits and alternatives were explained including but not limited to risks of bleeding, infection and damage to adjacent organs. Patient verbalized understanding and all questions were answered - Patient declined repeat ultrasound and endometrial biopsy due to financial limitations - patient will be contacted by surgical scheduler with date and  time of the procedure

## 2017-01-28 ENCOUNTER — Encounter (HOSPITAL_COMMUNITY): Payer: Self-pay

## 2017-03-20 ENCOUNTER — Other Ambulatory Visit: Payer: Self-pay | Admitting: Obstetrics and Gynecology

## 2017-03-20 NOTE — Patient Instructions (Addendum)
Instrucciones:  Su cirugia esta programada para-( your procedure is scheduled on) : Tuesday, Oct. 9, 2018   Entre por la entrada principal a la(s) -(enter through the main entrance at):  1:00 PM  [place] e informenos de su llegada ( pick up phone, dial [phone] on arrival)  Por favor llame al [phone] si tiene algun problema la [place] ( please call [phone] if you have any problems the morning of surgery.)  Recuerde: (Remember)  No coma alimentos despues de la medianoche del Monday ( Do not eat food or drink liquids including water after midnight on_______________  No tome liquidos, incluyendo agua, despues 8:30 AM Tuesday  [employer] la manana de la cirugia con un sorbito de [place] (take these meds the morning of surgery with a SIP of water)  None  Puede cepillarse los dientes en la manana de la [place]. (you may brush your teeth the morning of surgery)  NO use joyas, maquillaje de ojos, lapiz labial, crema para el cuerpo o esmalte de unas oscuro - las unas de los pies pueden estar pintados. ( Do not wear jewelry, eye makeup, lipstick, body lotion, or dark fingernail polish)  Puede usar desodorante ( you may wear deodorant)  Si va a ser ingresado despues de las [place], deje la [place] en el carro hasta que se le haya asignado una habitacion. ( If you are to be admitted after surgery, leave suitcase in car until your room has been assigned.)  A los pacientes que se les de de alta el mismo dia no se les permitira manejar a casa.  ( Patients discharged on the day of surgery will not be allowed to drive home)  Use ropa suelta y comoda de regreso a Manufacturing engineer. ( wear loose comfortable clothes for ride home)  Firma del paciente (patient signature) ______________________________________     Your procedure is scheduled on:  Tuesday, Oct. 9, 2018  Enter through the [employer] [facility] at:  1:00 PM  Pick up the phone at  the desk and dial [phone].  Call this number if you have problems the morning of surgery: [phone].  Remember: Do NOT eat food:  After Midnight Monday  Do NOT drink clear liquids after:  8:30 AM Tuesday  Take these medicines the morning of surgery with a SIP OF WATER:  None  Stop ALL herbal medications at this time  Do NOT smoke the day of surgery.  Do NOT wear jewelry (body piercing), metal hair clips/bobby pins, make-up, artifical eyelashes or nail polish. Do NOT wear lotions, powders, or perfumes.  You may wear deodorant. Do NOT shave for 48 hours prior to surgery. Do NOT bring valuables to the hospital. Contacts, dentures, or bridgework may not be worn into surgery.  Leave suitcase in car.  After surgery it may be brought to your room.  For patients admitted to the hospital, checkout time is 11:00 AM the day of discharge.  Bring a copy of your healthcare power of attorney and living will documents.

## 2017-03-25 ENCOUNTER — Encounter (HOSPITAL_COMMUNITY): Payer: Self-pay

## 2017-03-25 ENCOUNTER — Encounter (HOSPITAL_COMMUNITY)
Admission: RE | Admit: 2017-03-25 | Discharge: 2017-03-25 | Disposition: A | Discharge status: Home / Self Care | Payer: Self-pay | Source: Ambulatory Visit | Attending: Obstetrics and Gynecology | Admitting: Obstetrics and Gynecology

## 2017-03-25 DIAGNOSIS — Z01812 Encounter for preprocedural laboratory examination: Secondary | ICD-10-CM | POA: Insufficient documentation

## 2017-03-25 HISTORY — DX: Other specified disorders of nose and nasal sinuses: J34.89

## 2017-03-25 HISTORY — DX: Asymptomatic varicose veins of unspecified lower extremity: I83.90

## 2017-03-25 HISTORY — DX: Polydipsia: R63.1

## 2017-03-25 HISTORY — DX: Other specified soft tissue disorders: M79.89

## 2017-03-25 HISTORY — DX: Prediabetes: R73.03

## 2017-03-25 LAB — CBC
HEMATOCRIT: 34.1 % — AB (ref 36.0–46.0)
HEMOGLOBIN: 11.2 g/dL — AB (ref 12.0–15.0)
MCH: 27.7 pg (ref 26.0–34.0)
MCHC: 32.8 g/dL (ref 30.0–36.0)
MCV: 84.4 fL (ref 78.0–100.0)
Platelets: 330 10*3/uL (ref 150–400)
RBC: 4.04 MIL/uL (ref 3.87–5.11)
RDW: 17.6 % — ABNORMAL HIGH (ref 11.5–15.5)
WBC: 5.2 10*3/uL (ref 4.0–10.5)

## 2017-03-25 LAB — TYPE AND SCREEN
ABO/RH(D): O POS
ANTIBODY SCREEN: NEGATIVE

## 2017-03-25 LAB — BASIC METABOLIC PANEL
ANION GAP: 7 (ref 5–15)
BUN: 12 mg/dL (ref 6–20)
CO2: 24 mmol/L (ref 22–32)
Calcium: 9.7 mg/dL (ref 8.9–10.3)
Chloride: 107 mmol/L (ref 101–111)
Creatinine, Ser: 0.53 mg/dL (ref 0.44–1.00)
GLUCOSE: 113 mg/dL — AB (ref 65–99)
POTASSIUM: 4.3 mmol/L (ref 3.5–5.1)
Sodium: 138 mmol/L (ref 135–145)

## 2017-03-25 LAB — ABO/RH: ABO/RH(D): O POS

## 2017-03-25 NOTE — Pre-Procedure Instructions (Signed)
Used [employer] ID # [id number]

## 2017-04-01 NOTE — H&P (Signed)
Julie [name] is an 51 y.o. female G4P4 with AUB here for definitive treatment with hysterectomy. Patient with history of DUB medically managed with OCP. Patient reports despite regular use of OCP, she continues to experience a monthly 8-10 period. She reports some generalized weakness at times. She denies any history of blood transfusion  Pertinent Gynecological History: Menses: flow is moderate and regular every month without intermenstrual spotting Contraception: OCP (estrogen/progesterone) and tubal ligation DES exposure: denies Blood transfusions: none Sexually transmitted diseases: no past history Last mammogram: normal Date: 06/2016 Last pap: normal Date: 04/2016 OB History: G4, P4   Menstrual History: Patient's last menstrual period was 04/02/2017.    Past Medical History:  Diagnosis Date  . Anemia   . Bloated abdomen    post surgery   . Diarrhea   . Family history of adverse reaction to anesthesia    mother had problems with BP and passed away during surgery  . Gallstones   . Increased thirst    at night  . Nasal dryness    at night  . Nausea & vomiting   . Pre-diabetes   . Swelling of both lower extremities   . Varicose vein of leg     Past Surgical History:  Procedure Laterality Date  . CESAREAN SECTION    . CHOLECYSTECTOMY    . LAPAROSCOPIC CHOLECYSTECTOMY W/ CHOLANGIOGRAPHY  01/26/11  . TUBAL LIGATION      Family History  Problem Relation Age of Onset  . Cancer Mother        cervical  . Hypertension Mother     Social History:  reports that she has never smoked. She has never used smokeless tobacco. She reports that she drinks alcohol. She reports that she does not use drugs.  Allergies: No Known Allergies  Prescriptions Prior to Admission  Medication Sig Dispense Refill Last Dose  . ferrous sulfate 325 (65 FE) MG tablet TAKE ONE TABLET BY MOUTH ONCE DAILY WITH BREAKFAST (Patient taking differently: TAKE ONE TABLET BY MOUTH ONCE DAILY BEFORE  BREAKFAST) 90 tablet 3 04/01/2017 at Unknown time  . Multiple Vitamin (MULTIVITAMIN) tablet Take 1 tablet by mouth daily with lunch.    04/01/2017 at Unknown time  . SPRINTEC 28 0.25-35 MG-MCG tablet TAKE ONE TABLET BY MOUTH ONCE DAILY (Patient taking differently: TAKE ONE TABLET BY MOUTH ONCE DAILY AT BEDTIME.) 28 tablet 11 04/01/2017 at Unknown time    ROS See pertinent in HPI Blood pressure 132/64, pulse 64, temperature 98.5 F (36.9 C), temperature source Oral, resp. rate 15, last menstrual period 04/02/2017, SpO2 99 %. Physical Exam GENERAL: Well-developed, well-nourished female in no acute distress.  LUNGS: Clear to auscultation bilaterally.  HEART: Regular rate and rhythm. ABDOMEN: Soft, nontender, nondistended. No organomegaly. PELVIC: Deferred to OR EXTREMITIES: No cyanosis, clubbing, or edema, 2+ distal pulses.  Results for orders placed or performed during the hospital encounter of 04/02/17 (from the past 24 hour(s))  Pregnancy, urine     Status: None   Collection Time: 04/02/17  1:00 PM  Result Value Ref Range   Preg Test, Ur NEGATIVE NEGATIVE    No results found.  06/2015 pelvic ultrasound FINDINGS: Uterus  Measurements: At least 13.7 x 5.2 x 10.6 cm the difficult to measure and 1 image. Multiple uterine fibroids are identified. In the central anterior aspect of the uterus there is a myometrial fibroid which measures 7.7 x 6.5 x 7.0 Cm. Within the fundal sub serosal portion of the uterus there is a fibroid  measuring 5.9 x 5.3 x 6.6 cm. Within the posterior uterus a fibroid is 2.1 x 1.9 x 2.0 cm. Within the posterior lower uterus there is a fibroid measuring 3.1 x 2.7 x 3.3 cm.  Endometrium  Thickness: 14.0 mm. Homogeneous hyperechoic nodule in the endometrium is 2.5 x 1.1 cm.  Right ovary  Measurements: 1.9 x 1.2 x 1.5 cm. Normal appearance/no adnexal mass.  Left ovary  Measurements: The ovary is not visualized, either absent or obscured . No left  adnexal mass identified.  Other findings  No abnormal free fluid.  IMPRESSION: 1. Numerous uterine fibroids, enlarging the size of the uterus. 2. Largest fibroid is identified in the anterior central uterine body measuring 7.7 cm. 3. Possible endometrial lesions suggesting polyp by its appearance. This nodule measures 2.5 x 1.1 cm. Consider further evaluation with sonohysterogram for confirmation prior to hysteroscopy. Endometrial sampling should also be considered if patient is at high risk for endometrial carcinoma. (Ref: Radiological Reasoning: Algorithmic Workup of Abnormal Vaginal Bleeding with Endovaginal Sonography and Sonohysterography. AJR 2008; 742:V95-63) 4. Normal appearance of the right ovary; nonvisualized left ovary.   Electronically Signed   By: Nolon Nations M.D.   On: 07/21/2015 15:39  Assessment/Plan: 51 yo with DUB and fibroid uterus here for surgical management with hysterectomy - Risks, benefits and alternatives were explained including but not limited to risks of bleeding, infection and damage to adjacent organs.  - patient verbalized understanding and all questions were answered - Consent signed for total abdominal hysterectomy with bilateral salpingectomy   Ashok Sawaya 04/02/2017, 2:32 PM

## 2017-04-02 ENCOUNTER — Encounter (HOSPITAL_COMMUNITY): Payer: Self-pay | Admitting: Emergency Medicine

## 2017-04-02 ENCOUNTER — Inpatient Hospital Stay (HOSPITAL_COMMUNITY)
Admission: AD | Admit: 2017-04-02 | Discharge: 2017-04-04 | DRG: 743 | Disposition: A | Discharge status: Home / Self Care | Payer: Self-pay | Source: Ambulatory Visit | Attending: Obstetrics and Gynecology | Admitting: Obstetrics and Gynecology

## 2017-04-02 ENCOUNTER — Encounter (HOSPITAL_COMMUNITY)
Admission: AD | Disposition: A | Discharge status: Home / Self Care | Payer: Self-pay | Source: Ambulatory Visit | Attending: Obstetrics and Gynecology

## 2017-04-02 ENCOUNTER — Inpatient Hospital Stay (HOSPITAL_COMMUNITY): Payer: Self-pay | Admitting: Anesthesiology

## 2017-04-02 ENCOUNTER — Encounter (HOSPITAL_COMMUNITY): Payer: Self-pay

## 2017-04-02 DIAGNOSIS — N938 Other specified abnormal uterine and vaginal bleeding: Secondary | ICD-10-CM | POA: Diagnosis present

## 2017-04-02 DIAGNOSIS — Z90710 Acquired absence of both cervix and uterus: Secondary | ICD-10-CM | POA: Diagnosis present

## 2017-04-02 DIAGNOSIS — D649 Anemia, unspecified: Secondary | ICD-10-CM | POA: Diagnosis present

## 2017-04-02 DIAGNOSIS — D259 Leiomyoma of uterus, unspecified: Secondary | ICD-10-CM

## 2017-04-02 DIAGNOSIS — N920 Excessive and frequent menstruation with regular cycle: Secondary | ICD-10-CM | POA: Diagnosis present

## 2017-04-02 DIAGNOSIS — D252 Subserosal leiomyoma of uterus: Principal | ICD-10-CM | POA: Diagnosis present

## 2017-04-02 HISTORY — PX: HYSTERECTOMY ABDOMINAL WITH SALPINGECTOMY: SHX6725

## 2017-04-02 HISTORY — DX: Family history of other specified conditions: Z84.89

## 2017-04-02 LAB — PREGNANCY, URINE: Preg Test, Ur: NEGATIVE

## 2017-04-02 SURGERY — HYSTERECTOMY, TOTAL, ABDOMINAL, WITH SALPINGECTOMY
Anesthesia: General | Site: Abdomen | Laterality: Bilateral

## 2017-04-02 MED ORDER — SODIUM CHLORIDE 0.9% FLUSH
9.0000 mL | INTRAVENOUS | Status: DC | PRN
  Administered 2017-04-03: 3 mL via INTRAVENOUS
  Filled 2017-04-02: qty 9

## 2017-04-02 MED ORDER — HYDROMORPHONE HCL 1 MG/ML IJ SOLN
INTRAMUSCULAR | Status: AC
  Administered 2017-04-02: 0.5 mg via INTRAVENOUS
  Filled 2017-04-02: qty 0.5

## 2017-04-02 MED ORDER — ONDANSETRON HCL 4 MG/2ML IJ SOLN
INTRAMUSCULAR | Status: DC | PRN
  Administered 2017-04-02: 4 mg via INTRAVENOUS

## 2017-04-02 MED ORDER — KETOROLAC TROMETHAMINE 30 MG/ML IJ SOLN
INTRAMUSCULAR | Status: AC
  Filled 2017-04-02: qty 2

## 2017-04-02 MED ORDER — SCOPOLAMINE 1 MG/3DAYS TD PT72
1.0000 | MEDICATED_PATCH | Freq: Once | TRANSDERMAL | Status: DC
  Administered 2017-04-02: 1.5 mg via TRANSDERMAL

## 2017-04-02 MED ORDER — FENTANYL CITRATE (PF) 100 MCG/2ML IJ SOLN
INTRAMUSCULAR | Status: DC | PRN
  Administered 2017-04-02: 100 ug via INTRAVENOUS
  Administered 2017-04-02: 50 ug via INTRAVENOUS
  Administered 2017-04-02: 100 ug via INTRAVENOUS

## 2017-04-02 MED ORDER — EPHEDRINE 5 MG/ML INJ
INTRAVENOUS | Status: AC
  Filled 2017-04-02: qty 10

## 2017-04-02 MED ORDER — HYDROMORPHONE HCL 1 MG/ML IJ SOLN
INTRAMUSCULAR | Status: AC
  Filled 2017-04-02: qty 1

## 2017-04-02 MED ORDER — SCOPOLAMINE 1 MG/3DAYS TD PT72
MEDICATED_PATCH | TRANSDERMAL | Status: AC
  Administered 2017-04-02: 1.5 mg via TRANSDERMAL
  Filled 2017-04-02: qty 1

## 2017-04-02 MED ORDER — HYDROMORPHONE HCL 1 MG/ML IJ SOLN
INTRAMUSCULAR | Status: DC | PRN
  Administered 2017-04-02: 1 mg via INTRAVENOUS

## 2017-04-02 MED ORDER — LIDOCAINE HCL (CARDIAC) 20 MG/ML IV SOLN
INTRAVENOUS | Status: AC
  Filled 2017-04-02: qty 5

## 2017-04-02 MED ORDER — PROPOFOL 10 MG/ML IV BOLUS
INTRAVENOUS | Status: DC | PRN
  Administered 2017-04-02: 200 mg via INTRAVENOUS

## 2017-04-02 MED ORDER — DIPHENHYDRAMINE HCL 12.5 MG/5ML PO ELIX: 12.5000 mg | ORAL_SOLUTION | Freq: Four times a day (QID) | ORAL | Status: DC | PRN

## 2017-04-02 MED ORDER — DEXAMETHASONE SODIUM PHOSPHATE 10 MG/ML IJ SOLN
INTRAMUSCULAR | Status: DC | PRN
  Administered 2017-04-02: 10 mg via INTRAVENOUS

## 2017-04-02 MED ORDER — HYDROMORPHONE HCL 1 MG/ML IJ SOLN
0.2500 mg | INTRAMUSCULAR | Status: DC | PRN
  Administered 2017-04-02 (×4): 0.5 mg via INTRAVENOUS

## 2017-04-02 MED ORDER — PROPOFOL 10 MG/ML IV BOLUS
INTRAVENOUS | Status: AC
  Filled 2017-04-02: qty 20

## 2017-04-02 MED ORDER — 0.9 % SODIUM CHLORIDE (POUR BTL) OPTIME
TOPICAL | Status: DC | PRN
  Administered 2017-04-02: 2000 mL

## 2017-04-02 MED ORDER — ROCURONIUM BROMIDE 100 MG/10ML IV SOLN
INTRAVENOUS | Status: AC
  Filled 2017-04-02: qty 1

## 2017-04-02 MED ORDER — FENTANYL CITRATE (PF) 100 MCG/2ML IJ SOLN
INTRAMUSCULAR | Status: AC
  Filled 2017-04-02: qty 2

## 2017-04-02 MED ORDER — SUGAMMADEX SODIUM 200 MG/2ML IV SOLN
INTRAVENOUS | Status: DC | PRN
  Administered 2017-04-02: 160 mg via INTRAVENOUS

## 2017-04-02 MED ORDER — MIDAZOLAM HCL 5 MG/5ML IJ SOLN
INTRAMUSCULAR | Status: DC | PRN
  Administered 2017-04-02: 2 mg via INTRAVENOUS

## 2017-04-02 MED ORDER — DIPHENHYDRAMINE HCL 50 MG/ML IJ SOLN: 12.5000 mg | Freq: Four times a day (QID) | INTRAMUSCULAR | Status: DC | PRN

## 2017-04-02 MED ORDER — HYDROMORPHONE HCL 1 MG/ML IJ SOLN
INTRAMUSCULAR | Status: AC
  Filled 2017-04-02: qty 0.5

## 2017-04-02 MED ORDER — CEFAZOLIN SODIUM-DEXTROSE 2-4 GM/100ML-% IV SOLN
2.0000 g | INTRAVENOUS | Status: AC
  Administered 2017-04-02: 2 g via INTRAVENOUS

## 2017-04-02 MED ORDER — PHENYLEPHRINE HCL 10 MG/ML IJ SOLN
INTRAMUSCULAR | Status: DC | PRN
  Administered 2017-04-02 (×2): 80 ug via INTRAVENOUS

## 2017-04-02 MED ORDER — EPHEDRINE SULFATE 50 MG/ML IJ SOLN
INTRAMUSCULAR | Status: DC | PRN
  Administered 2017-04-02: 10 mg via INTRAVENOUS

## 2017-04-02 MED ORDER — PROMETHAZINE HCL 25 MG/ML IJ SOLN: 6.2500 mg | INTRAMUSCULAR | Status: DC | PRN

## 2017-04-02 MED ORDER — FENTANYL CITRATE (PF) 100 MCG/2ML IJ SOLN
25.0000 ug | INTRAMUSCULAR | Status: DC | PRN
  Administered 2017-04-02 (×3): 50 ug via INTRAVENOUS

## 2017-04-02 MED ORDER — PHENYLEPHRINE 40 MCG/ML (10ML) SYRINGE FOR IV PUSH (FOR BLOOD PRESSURE SUPPORT)
PREFILLED_SYRINGE | INTRAVENOUS | Status: AC
  Filled 2017-04-02: qty 10

## 2017-04-02 MED ORDER — LACTATED RINGERS IV SOLN
INTRAVENOUS | Status: DC
  Administered 2017-04-02: 20:00:00 via INTRAVENOUS

## 2017-04-02 MED ORDER — DEXAMETHASONE SODIUM PHOSPHATE 10 MG/ML IJ SOLN
INTRAMUSCULAR | Status: AC
  Filled 2017-04-02: qty 1

## 2017-04-02 MED ORDER — ONDANSETRON HCL 4 MG/2ML IJ SOLN: 4.0000 mg | Freq: Four times a day (QID) | INTRAMUSCULAR | Status: DC | PRN

## 2017-04-02 MED ORDER — HYDROMORPHONE HCL 1 MG/ML IJ SOLN
INTRAMUSCULAR | Status: AC
  Administered 2017-04-02: 0.5 mg via INTRAVENOUS
  Filled 2017-04-02: qty 1

## 2017-04-02 MED ORDER — FENTANYL CITRATE (PF) 250 MCG/5ML IJ SOLN
INTRAMUSCULAR | Status: AC
  Filled 2017-04-02: qty 5

## 2017-04-02 MED ORDER — LACTATED RINGERS IV SOLN
INTRAVENOUS | Status: DC
  Administered 2017-04-02 (×3): via INTRAVENOUS

## 2017-04-02 MED ORDER — BUPIVACAINE HCL (PF) 0.25 % IJ SOLN
INTRAMUSCULAR | Status: AC
  Filled 2017-04-02: qty 30

## 2017-04-02 MED ORDER — FENTANYL CITRATE (PF) 100 MCG/2ML IJ SOLN
INTRAMUSCULAR | Status: AC
  Administered 2017-04-02: 50 ug via INTRAVENOUS
  Filled 2017-04-02: qty 2

## 2017-04-02 MED ORDER — SUGAMMADEX SODIUM 200 MG/2ML IV SOLN
INTRAVENOUS | Status: AC
  Filled 2017-04-02: qty 2

## 2017-04-02 MED ORDER — ROCURONIUM BROMIDE 100 MG/10ML IV SOLN
INTRAVENOUS | Status: DC | PRN
  Administered 2017-04-02: 60 mg via INTRAVENOUS

## 2017-04-02 MED ORDER — MIDAZOLAM HCL 2 MG/2ML IJ SOLN
INTRAMUSCULAR | Status: AC
  Filled 2017-04-02: qty 2

## 2017-04-02 MED ORDER — HYDROMORPHONE 1 MG/ML IV SOLN
INTRAVENOUS | Status: DC
  Administered 2017-04-03: 0.15 mL via INTRAVENOUS
  Administered 2017-04-03: 0.3 mg via INTRAVENOUS
  Administered 2017-04-03: 0 mg via INTRAVENOUS

## 2017-04-02 MED ORDER — HYDROMORPHONE 1 MG/ML IV SOLN
INTRAVENOUS | Status: DC
  Administered 2017-04-02: 21:00:00 via INTRAVENOUS
  Administered 2017-04-02: 0.8 mg via INTRAVENOUS
  Filled 2017-04-02: qty 25

## 2017-04-02 MED ORDER — ONDANSETRON HCL 4 MG/2ML IJ SOLN
INTRAMUSCULAR | Status: AC
  Filled 2017-04-02: qty 2

## 2017-04-02 MED ORDER — LIDOCAINE HCL (CARDIAC) 20 MG/ML IV SOLN
INTRAVENOUS | Status: DC | PRN
  Administered 2017-04-02: 100 mg via INTRAVENOUS

## 2017-04-02 MED ORDER — NALOXONE HCL 0.4 MG/ML IJ SOLN: 0.4000 mg | INTRAMUSCULAR | Status: DC | PRN

## 2017-04-02 MED ORDER — BUPIVACAINE HCL (PF) 0.25 % IJ SOLN
INTRAMUSCULAR | Status: DC | PRN
  Administered 2017-04-02: 30 mL

## 2017-04-02 MED ORDER — SUGAMMADEX SODIUM 500 MG/5ML IV SOLN
INTRAVENOUS | Status: AC
  Filled 2017-04-02: qty 5

## 2017-04-02 SURGICAL SUPPLY — 39 items
BRR ADH 6X5 SEPRAFILM 1 SHT (MISCELLANEOUS)
CANISTER SUCT 3000ML PPV (MISCELLANEOUS) ×3 IMPLANT
CLOTH BEACON ORANGE TIMEOUT ST (SAFETY) ×3 IMPLANT
CONT PATH 16OZ SNAP LID 3702 (MISCELLANEOUS) ×3 IMPLANT
DECANTER SPIKE VIAL GLASS SM (MISCELLANEOUS) ×2 IMPLANT
DRAPE CESAREAN BIRTH W POUCH (DRAPES) ×3 IMPLANT
DRAPE WARM FLUID 44X44 (DRAPE) ×2 IMPLANT
DRSG OPSITE POSTOP 4X10 (GAUZE/BANDAGES/DRESSINGS) ×3 IMPLANT
DURAPREP 26ML APPLICATOR (WOUND CARE) ×3 IMPLANT
GAUZE SPONGE 4X4 12PLY STRL LF (GAUZE/BANDAGES/DRESSINGS) ×2 IMPLANT
GAUZE SPONGE 4X4 16PLY XRAY LF (GAUZE/BANDAGES/DRESSINGS) ×3 IMPLANT
GLOVE BIOGEL PI IND STRL 6.5 (GLOVE) ×1 IMPLANT
GLOVE BIOGEL PI IND STRL 7.0 (GLOVE) ×2 IMPLANT
GLOVE BIOGEL PI INDICATOR 6.5 (GLOVE) ×2
GLOVE BIOGEL PI INDICATOR 7.0 (GLOVE) ×4
GLOVE SURG SS PI 6.0 STRL IVOR (GLOVE) ×3 IMPLANT
GOWN STRL REUS W/TWL LRG LVL3 (GOWN DISPOSABLE) ×9 IMPLANT
HEMOSTAT ARISTA ABSORB 3G PWDR (MISCELLANEOUS) ×2 IMPLANT
NEEDLE HYPO 22GX1.5 SAFETY (NEEDLE) ×3 IMPLANT
NS IRRIG 1000ML POUR BTL (IV SOLUTION) ×3 IMPLANT
PACK ABDOMINAL GYN (CUSTOM PROCEDURE TRAY) ×3 IMPLANT
PAD ABD 7.5X8 STRL (GAUZE/BANDAGES/DRESSINGS) ×2 IMPLANT
PAD ABD 8X7 1/2 STERILE (GAUZE/BANDAGES/DRESSINGS) ×3 IMPLANT
PAD OB MATERNITY 4.3X12.25 (PERSONAL CARE ITEMS) ×3 IMPLANT
PENCIL SMOKE EVAC W/HOLSTER (ELECTROSURGICAL) ×3 IMPLANT
PROTECTOR NERVE ULNAR (MISCELLANEOUS) ×4 IMPLANT
RTRCTR C-SECT PINK 25CM LRG (MISCELLANEOUS) ×2 IMPLANT
SEPRAFILM MEMBRANE 5X6 (MISCELLANEOUS) IMPLANT
SPONGE GAUZE 4X4 12PLY STER LF (GAUZE/BANDAGES/DRESSINGS) ×3 IMPLANT
SPONGE LAP 18X18 X RAY DECT (DISPOSABLE) IMPLANT
SUT VIC AB 0 CT1 18XCR BRD8 (SUTURE) ×2 IMPLANT
SUT VIC AB 0 CT1 36 (SUTURE) ×6 IMPLANT
SUT VIC AB 0 CT1 8-18 (SUTURE) ×6
SUT VIC AB 4-0 KS 27 (SUTURE) ×3 IMPLANT
SUT VICRYL 0 TIES 12 18 (SUTURE) ×3 IMPLANT
SYR CONTROL 10ML LL (SYRINGE) ×3 IMPLANT
TAPE CLOTH SURG 4X10 WHT LF (GAUZE/BANDAGES/DRESSINGS) ×2 IMPLANT
TOWEL OR 17X24 6PK STRL BLUE (TOWEL DISPOSABLE) ×6 IMPLANT
TRAY FOLEY CATH SILVER 14FR (SET/KITS/TRAYS/PACK) ×3 IMPLANT

## 2017-04-02 NOTE — Transfer of Care (Signed)
Immediate Anesthesia Transfer of Care Note  Patient: Julie [name]  Procedure(s) Performed: HYSTERECTOMY ABDOMINAL WITH SALPINGECTOMY (Bilateral Abdomen)  Patient Location: PACU  Anesthesia Type:General  Level of Consciousness: awake, alert  and oriented  Airway & Oxygen Therapy: Patient Spontanous Breathing and Patient connected to nasal cannula oxygen  Post-op Assessment: Report given to RN and Post -op Vital signs reviewed and stable  Post vital signs: Reviewed and stable  Last Vitals:  Vitals:   04/02/17 1318  BP: 132/64  Pulse: 64  Resp: 15  Temp: 36.9 C  SpO2: 99%    Last Pain:  Vitals:   04/02/17 1318  TempSrc: Oral      Patients Stated Pain Goal: 3 (82/50/53 9767)  Complications: No apparent anesthesia complications

## 2017-04-02 NOTE — Anesthesia Procedure Notes (Signed)
Procedure Name: Intubation Date/Time: 04/02/2017 3:33 PM Performed by: Riki Sheer Pre-anesthesia Checklist: Patient identified, Emergency Drugs available, Suction available, Patient being monitored and Timeout performed Patient Re-evaluated:Patient Re-evaluated prior to induction Oxygen Delivery Method: Circle system utilized Preoxygenation: Pre-oxygenation with 100% oxygen Induction Type: IV induction Ventilation: Mask ventilation without difficulty Laryngoscope Size: Miller and 2 Grade View: Grade I Tube type: Oral Tube size: 7.0 mm Number of attempts: 1 Airway Equipment and Method: Stylet Placement Confirmation: ETT inserted through vocal cords under direct vision,  positive ETCO2,  CO2 detector and breath sounds checked- equal and bilateral Secured at: 20 cm Tube secured with: Tape Dental Injury: Teeth and Oropharynx as per pre-operative assessment

## 2017-04-02 NOTE — Anesthesia Preprocedure Evaluation (Addendum)
Anesthesia Evaluation  Patient identified by MRN, date of birth, ID band Patient awake    Reviewed: Allergy & Precautions, NPO status , Patient's Chart, lab work & pertinent test results  Airway Mallampati: III  TM Distance: >3 FB Neck ROM: Full    Dental  (+) Dental Advisory Given, Teeth Intact   Pulmonary neg pulmonary ROS,    Pulmonary exam normal breath sounds clear to auscultation       Cardiovascular negative cardio ROS Normal cardiovascular exam Rhythm:Regular Rate:Normal  Varicose veins   Neuro/Psych Depression negative neurological ROS     GI/Hepatic negative GI ROS, Neg liver ROS,   Endo/Other  Pre-diabetes  Renal/GU negative Renal ROS  negative genitourinary   Musculoskeletal negative musculoskeletal ROS (+)   Abdominal   Peds  Hematology  (+) anemia ,   Anesthesia Other Findings   Reproductive/Obstetrics fibroids                            Anesthesia Physical Anesthesia Plan  ASA: II  Anesthesia Plan: General   Post-op Pain Management:    Induction: Intravenous  PONV Risk Score and Plan: 3 and Ondansetron, Dexamethasone, Midazolam and Treatment may vary due to age or medical condition  Airway Management Planned: Oral ETT  Additional Equipment: None  Intra-op Plan:   Post-operative Plan: Extubation in OR  Informed Consent: I have reviewed the patients History and Physical, chart, labs and discussed the procedure including the risks, benefits and alternatives for the proposed anesthesia with the patient or authorized representative who has indicated his/her understanding and acceptance.   Dental advisory given  Plan Discussed with: CRNA  Anesthesia Plan Comments:         Anesthesia Quick Evaluation

## 2017-04-02 NOTE — Op Note (Signed)
Julie [name] PROCEDURE DATE: 04/02/2017  PREOPERATIVE DIAGNOSIS:  51 y.o. [id number] with symptomatic fibroids, menorrhagia POSTOPERATIVE DIAGNOSIS:  Same SURGEON:   Julie [name], M.D. ASSISTANT: Julie [name], M.D.   OPERATION:  Total abdominal hysterectomy,Bilateral Salpingectomy ANESTHESIA:  General endotracheal.  INDICATIONS: The patient is a 51 y.o. [id number] with history of symptomatic uterine fibroids/menorrhagia. The patient made a decision to undergo definite surgical treatment. On the preoperative visit, the risks, benefits, indications, and alternatives of the procedure were reviewed with the patient.  On the day of surgery, the risks of surgery were again discussed with the patient including but not limited to: bleeding which may require transfusion or reoperation; infection which may require antibiotics; injury to bowel, bladder, ureters or other surrounding organs; need for additional procedures; thromboembolic phenomenon, incisional problems and other postoperative/anesthesia complications. Written informed consent was obtained.    OPERATIVE FINDINGS: A 16 week size fibroid uterus with weight 467.3 g with normal tubes and ovaries bilaterally.  ESTIMATED BLOOD LOSS: 250 ml FLUIDS:  2200 ml of Lactated Ringers URINE OUTPUT:  150 ml of clear yellow urine. SPECIMENS:  Uterus, cervix and bilateral tubes sent to pathology COMPLICATIONS:  None immediate.   DESCRIPTION OF PROCEDURE: The patient received intravenous antibiotics and had sequential compression devices applied to her lower extremities while in the preoperative area.   She was taken to the operating room where anesthesia was induced and found to be adequate. The patient was placed in supine position. The abdomen and perineum were prepped and draped in a sterile manner, and a Foley catheter was inserted into the bladder and attached to Julie [name] drainage. After an adequate timeout was performed, a Pfannensteil skin incision  was made. This incision was taken down to the fascia using electrocautery with care given to maintain good hemostasis. The fascia was incised in the midline and the fascial incision was then extended bilaterally using mayo scissors. The fascia was then dissected off the underlying rectus muscles using blunt and sharp dissection. The rectus muscles were split bluntly in the midline and the peritoneum entered sharply without complication. This peritoneal incision was then extended superiorly and inferiorly with care given to prevent bowel or bladder injury. Upon entry into the abdominal cavity, the upper abdomen was inspected and found to be normal. Attention was then turned to the pelvis. A retractor was placed into the incision, and the bowel was packed away with moist laparotomy sponges. The uterus at this point was noted to be mobilized. The round ligaments on each side were clamped, suture ligated, and transected with electrocautery allowing entry into the broad ligament. The anterior and posterior leaves of the broad ligament were separated and the ureters were inspected to be safely away from the area of dissection bilaterally. A hole was created in the clear portion of the posterior broad ligament. Adnexae were clamped on the patient's right side. This pedicle was then clamped, cut, and doubly suture ligated with good hemostasis. This procedure was repeated in an identical fashion on the left site allowing for both adnexa to remain in place.   A bladder flap was then created across the anterior leaf of the broad ligament and the bladder was then bluntly dissected off the lower uterine segment and cervix with good hemostasis. The uterine arteries were then skeletonized bilaterally and then clamped, cut, and ligated with care given to prevent ureteral injury. The uterosacral ligaments were then clamped, cut, and ligated bilaterally. Finally, the cardinal ligaments were clamped, cut, and ligated bilaterally.  Acutely curved clamps were placed across the vagina just under the cervix, and the specimen was amputated and sent to pathology. The vaginal cuff was closed with a series of interrupted 0 Vicryl figure-of-eight sutures with care given to incorporate the anterior pubocervical fascia and the posterior rectovaginal fascia.  The vaginal cuff angles were noted to have good hemostasis. The right fallopian tube was identified with a babcock clamp, grasped with a Kelly clamps, transected and suture ligated. The same procedure was performed on the left side allowing for bilateral salpingectomy. The pelvis was irrigated and hemostasis was reconfirmed at all pedicles and along the pelvic sidewall. To ensure persistence of hemostasis, Arista was applied over the vaginal cuff and all pedicles. All laparotomy sponges and instruments were removed from the abdomen. The peritoneum was closed with 2-0 Vicryl, and the fascia was closed with 0 Vicryl in a running fashion. The skin was closed with a 4-0 Monocryl subcuticular stitch. Sponge, lap, needle, and instrument counts were correct times two. The patient was taken to the recovery area awake, extubated and in stable condition.

## 2017-04-02 NOTE — Anesthesia Postprocedure Evaluation (Signed)
Anesthesia Post Note  Patient: Julie [name]  Procedure(s) Performed: HYSTERECTOMY ABDOMINAL WITH SALPINGECTOMY (Bilateral Abdomen)     Patient location during evaluation: PACU Anesthesia Type: General Level of consciousness: awake and alert Pain management: pain level controlled Vital Signs Assessment: post-procedure vital signs reviewed and stable Respiratory status: spontaneous breathing, nonlabored ventilation and respiratory function stable Cardiovascular status: blood pressure returned to baseline and stable Postop Assessment: no apparent nausea or vomiting Anesthetic complications: no    Last Vitals:  Vitals:   04/02/17 1930 04/02/17 1945  BP: 139/69 (!) 145/61  Pulse: 78 63  Resp: 14 16  Temp: 37 C 36.9 C  SpO2: 96% 100%    Last Pain:  Vitals:   04/02/17 1945  TempSrc: Oral  PainSc:    Pain Goal: Patients Stated Pain Goal: 3 (04/02/17 1855)               Audry Pili

## 2017-04-03 ENCOUNTER — Encounter (HOSPITAL_COMMUNITY): Payer: Self-pay | Admitting: Obstetrics and Gynecology

## 2017-04-03 LAB — CBC
HEMATOCRIT: 32 % — AB (ref 36.0–46.0)
HEMOGLOBIN: 10.8 g/dL — AB (ref 12.0–15.0)
MCH: 28.7 pg (ref 26.0–34.0)
MCHC: 33.8 g/dL (ref 30.0–36.0)
MCV: 85.1 fL (ref 78.0–100.0)
PLATELETS: 248 10*3/uL (ref 150–400)
RBC: 3.76 MIL/uL — AB (ref 3.87–5.11)
RDW: 17 % — ABNORMAL HIGH (ref 11.5–15.5)
WBC: 6.3 10*3/uL (ref 4.0–10.5)

## 2017-04-03 MED ORDER — SIMETHICONE 80 MG PO CHEW
80.0000 mg | CHEWABLE_TABLET | Freq: Four times a day (QID) | ORAL | Status: DC | PRN
  Administered 2017-04-03: 80 mg via ORAL
  Filled 2017-04-03: qty 1

## 2017-04-03 MED ORDER — IBUPROFEN 600 MG PO TABS: 600.0000 mg | ORAL_TABLET | Freq: Four times a day (QID) | ORAL | Status: DC | PRN

## 2017-04-03 MED ORDER — OXYCODONE-ACETAMINOPHEN 5-325 MG PO TABS
1.0000 | ORAL_TABLET | Freq: Four times a day (QID) | ORAL | Status: DC | PRN
  Administered 2017-04-03: 2 via ORAL
  Administered 2017-04-03: 1 via ORAL
  Administered 2017-04-04: 2 via ORAL
  Filled 2017-04-03: qty 1
  Filled 2017-04-03 (×2): qty 2

## 2017-04-03 MED ORDER — LACTATED RINGERS IV SOLN
INTRAVENOUS | Status: DC
  Administered 2017-04-03: 125 mL/h via INTRAVENOUS

## 2017-04-03 NOTE — Progress Notes (Signed)
Call to [place]. Reviewed patient status of drowsiness and respiratory rate 10-14/min with PCA Dilaudid. Requested that PCA dose be decreased so that patient will not be excessively sleepy and have decreased respiratory rate. PCA button currently out of patient's reach until she is less drowsy. Patient and family (daughter) educated that patient may use the medication with RN supervision as needed. Will continue to frequently evaluate respiratory rate, sedation and level of comfort.

## 2017-04-03 NOTE — Progress Notes (Signed)
61ml Dilaudid PCA WIS witnessed by Ivin Poot RN

## 2017-04-03 NOTE — Progress Notes (Signed)
Assisted Dr. Elly Modena with explanation of care plan. Patient's daughter states that she will order her meals. [place] Interpreter.

## 2017-04-03 NOTE — Progress Notes (Signed)
Pt's daughter states pt feels hot/flushed. Temp 98.7  Patient has been encouraged to eat this morning. She states she is not hungry.  Pt educated regarding discontinuing IV if patient will eat and drink.

## 2017-04-03 NOTE — Progress Notes (Signed)
1 Day Post-Op Procedure(s) (LRB): HYSTERECTOMY ABDOMINAL WITH SALPINGECTOMY (Bilateral)  Subjective: Patient reports some incisional pain well controlled with pain medication. She denies nausea/emesis. Patient tolerated sips of water yesterday evening. Patient denies CP, SOB, lightheadedness/dizziness  Objective: I have reviewed patient's vital signs, intake and output, medications and labs.  General: alert, cooperative and no distress Resp: clear to auscultation bilaterally Cardio: regular rate and rhythm GI: normal findings: bowel sounds normal and soft, appropriately tender and incision: clean, dry and intact Extremities: Homans sign is negative, no sign of DVT and no edema, redness or tenderness in the calves or thighs  Assessment: s/p Procedure(s): HYSTERECTOMY ABDOMINAL WITH SALPINGECTOMY (Bilateral): stable and progressing well  Plan: Advance diet Encourage ambulation Advance to PO medication Discontinue IV fluids  Discontinue foley  LOS: 1 day    Zachery Niswander 04/03/2017, 7:30 AM

## 2017-04-04 MED ORDER — DOCUSATE SODIUM 100 MG PO CAPS: 100.0000 mg | ORAL_CAPSULE | Freq: Two times a day (BID) | ORAL | 2 refills | Status: DC | PRN

## 2017-04-04 MED ORDER — IBUPROFEN 600 MG PO TABS: 600.0000 mg | ORAL_TABLET | Freq: Four times a day (QID) | ORAL | 3 refills | Status: DC | PRN

## 2017-04-04 MED ORDER — OXYCODONE-ACETAMINOPHEN 5-325 MG PO TABS: 1.0000 | ORAL_TABLET | Freq: Four times a day (QID) | ORAL | 0 refills | Status: DC | PRN

## 2017-04-04 NOTE — Plan of Care (Signed)
Problem: Pain Managment: Goal: General experience of comfort will improve Outcome: Progressing Patient tolerating light activity, ambulating and up in chair. Denies need of pain medication.   Problem: Physical Regulation: Goal: Ability to maintain clinical measurements within normal limits will improve Outcome: Progressing Vital signs stable.  Goal: Will remain free from infection Outcome: Progressing Patient is afebrile and incision area has no drainage or redness.  Problem: Skin Integrity: Goal: Risk for impaired skin integrity will decrease Outcome: Progressing Patient in up in room and hallway with stand by assist. Tolerating activity well.

## 2017-04-04 NOTE — Progress Notes (Addendum)
Discharge instructions given via [place] interpreter.  Patient did not have any questions at this time.

## 2017-04-04 NOTE — Discharge Summary (Signed)
Physician Discharge Summary  Patient ID: Julie [name] MRN: [id number] DOB/AGE: 1965/11/06 51 y.o.  Admit date: 04/02/2017 Discharge date: 04/04/2017  Admission Diagnoses: symptomatic fibroid  Discharge Diagnoses:  Active Problems:   S/P total abdominal hysterectomy   Discharged Condition: good  Hospital Course: Patient admitted for the scheduled surgical management of fibroid uterus. Patient underwent an uncomplicated total abdominal hysterectomy with bilateral salpingectomy (see operative report for details). Patient had an uncomplicated post operative course. She remained afebrile. She ambulated, passed flatus and tolerated oral intake. Patient was found stable for discharge. Discharge instructions were reviewed with the patient.  Consults: None  Discharge Exam: Blood pressure (!) 120/49, pulse 67, temperature (!) 97.5 F (36.4 C), temperature source Oral, resp. rate 16, last menstrual period 04/02/2017, SpO2 97 %. General appearance: alert, cooperative and no distress Resp: clear to auscultation bilaterally Cardio: regular rate and rhythm GI: soft, appropriately tender, non distended. Incision dressing: clean dry and intact Extremities: Homans sign is negative, no sign of DVT and no edema, redness or tenderness in the calves or thighs  Disposition: 01-Home or Self Care   Allergies as of 04/04/2017   No Known Allergies     Medication List    STOP taking these medications   SPRINTEC 28 0.25-35 MG-MCG tablet Generic drug:  norgestimate-ethinyl estradiol     TAKE these medications   docusate sodium 100 MG capsule Commonly known as:  COLACE Take 1 capsule (100 mg total) by mouth 2 (two) times daily as needed.   ferrous sulfate 325 (65 FE) MG tablet TAKE ONE TABLET BY MOUTH ONCE DAILY WITH BREAKFAST What changed:  See the new instructions.   ibuprofen 600 MG tablet Commonly known as:  ADVIL,MOTRIN Take 1 tablet (600 mg total) by mouth every 6 (six) hours as  needed for fever or headache.   multivitamin tablet Take 1 tablet by mouth daily with lunch.   oxyCODONE-acetaminophen 5-325 MG tablet Commonly known as:  PERCOCET/ROXICET Take 1-2 tablets by mouth every 6 (six) hours as needed for moderate pain or severe pain.      Follow-up [facility].   Specialty:  Obstetrics and Gynecology Why:  An appointment will be made for you in 4 weeks Contact information: [place] [phone]          Signed: Mora Bellman 04/04/2017, 7:42 AM

## 2017-04-04 NOTE — Plan of Care (Signed)
Problem: Bowel/Gastric: Goal: Gastrointestinal status for postoperative course will improve Outcome: Adequate for Discharge Bowel Sounds adequate. Patient not yet passing gas.   Problem: Physical Regulation: Goal: Postoperative complications will be avoided or minimized Outcome: Progressing No signs of postoperative complications at this time.   Problem: Respiratory: Goal: Ability to maintain adequate ventilation will improve Outcome: Progressing Respiratory status within normal limits.  Patient maintaining adequate ventilation at this time.   Problem: Skin Integrity: Goal: Demonstration of wound healing without infection will improve Outcome: Adequate for Discharge No signs of wound infections.  Patient educated of wound care and signs of infection using Spanish interpreter   Problem: Urinary Elimination: Goal: Ability to reestablish a normal urinary elimination pattern will improve Outcome: Progressing Patient voiding without complications

## 2017-04-04 NOTE — Discharge Instructions (Signed)
Histerectoma abdominal, cuidados posteriores (Abdominal Hysterectomy, Care After) Siga estas instrucciones durante las prximas semanas. Estas indicaciones le proporcionan informacin general acerca de cmo deber cuidarse despus del procedimiento. El mdico tambin podr darle instrucciones ms especficas. El tratamiento se ha planificado de acuerdo a las prcticas mdicas actuales, pero a veces se producen problemas. Comunquese con el mdico si tiene algn problema o tiene dudas despus del procedimiento. QU ESPERAR DESPUS DEL PROCEDIMIENTO Despus del procedimiento, es normal tener los siguientes sntomas:  Dolor.  Cansancio.  Prdida del apetito.  Menos inters sexual. La recuperacin de esta ciruga demora de 4 a 6 semanas. [name] los medicamentos para Clinical biochemist como se lo indic el mdico. No tome medicamentos de venta libre para Glass blower/designer sin consultar antes al mdico.  Cambie el vendaje como se lo indic el mdico.  Debe ver nuevamente al mdico para que le saque las suturas.  Tome duchas en lugar de baos durante 2 a 3 semanas. Pregntele al mdico cundo es seguro comenzar a tomar duchas.  No se haga duchas vaginales, no use tampones ni tenga relaciones sexuales durante al menos 6semanas o hasta que el mdico la autorice.  Siga las indicaciones del mdico con respecto a la actividad fsica, a Surveyor, quantity, a conducir el automvil y a las actividades en general.  Descanse y duerma lo suficiente.  No levante nada que sea ms pesado que un galn de Air traffic controller (alrededor de 10lb [4.5kg]) durante el primer mes posterior a la Leisure centre manager.  Puede retomar su dieta normal si el mdico la autoriza.  No beba alcohol hasta que el mdico se lo autorice.  Si tiene estreimiento, pregntele al mdico si puede tomar un laxante suave.  Los alimentos con alto contenido de [place] tambin pueden tener un efecto laxante.  Coma muchas frutas y vegetales crudos, cereales integrales y frijoles.  Beba suficiente lquido para Consulting civil engineer orina clara o de color amarillo plido.  Trate de que haya alguna persona en su casa para ayudarla con las tareas del hogar durante 1 a 2 semanas despus de la [place].  Cumpla con todas las visitas de control.  SOLICITE ATENCIN MDICA SI:  Siente escalofros o tiene fiebre.  Tiene sntomas de hinchazn, enrojecimiento o dolor en el rea de la incisin que estn empeorando.  Tiene pus en el lugar de la incisin.  Advierte un olor ftido que proviene de la incisin o del vendaje.  La herida se abre.  Se siente mareada o sufre un desmayo.  Siente dolor o tiene una hemorragia al [employer].  Tiene diarrea persistente.  Tiene nuseas o vmitos persistentes.  Tiene flujo vaginal anormal.  Tiene una erupcin cutnea.  Tiene alguna reaccin anormal o aparece una alergia por los medicamentos.  El medicamento no Production designer, theatre/television/film.  SOLICITE ATENCIN MDICA DE INMEDIATO SI:  Tiene fiebre y los sntomas empeoran repentinamente.  Siente un dolor abdominal intenso.  Siente dolor en el pecho.  Le falta el aire.  Se desmaya.  Siente dolor, u observa hinchazn o enrojecimiento en la pierna.  Tiene una hemorragia vaginal abundante, con cogulos.  ASEGRESE DE QUE:  Comprende estas instrucciones.  Controlar su afeccin.  Recibir ayuda de inmediato si no mejora o si empeora.  Esta informacin no tiene Marine scientist el consejo del mdico. Asegrese de hacerle al mdico cualquier pregunta que tenga. Document Released: 12/23/2006 Document Revised: 07/02/2014 Document Reviewed: 04/03/2013 Elsevier Interactive Patient Education  2017  [employer].   Abdominal Hysterectomy, Care After This sheet gives you information about how to care for yourself after your procedure. Your health care provider may also give you more specific instructions. If you have  problems or questions, contact your health care provider. What can I expect after the procedure? After your procedure, it is common to have:  Pain.  Fatigue.  Poor appetite.  Less interest in sex.  Vaginal bleeding and discharge. You may need to use a sanitary napkin after this procedure.  Follow these instructions at home: Bathing  Do not take baths, swim, or use a hot tub until your health care provider approves. Ask your health care provider if you can take showers. You may only be allowed to take sponge baths for bathing.  Keep the bandage (dressing) dry until your health care provider says it can be removed. Incision care  Follow instructions from your health care provider about how to take care of your incision. Make sure you: ? Wash your hands with soap and water before you change your bandage (dressing). If soap and water are not available, use hand sanitizer. ? Change your dressing as told by your health care provider. ? Leave stitches (sutures), skin glue, or adhesive strips in place. These skin closures may need to stay in place for 2 weeks or longer. If adhesive strip edges start to loosen and curl up, you may trim the loose edges. Do not remove adhesive strips completely unless your health care provider tells you to do that.  Check your incision area every day for signs of infection. Check for: ? Redness, swelling, or pain. ? Fluid or blood. ? Warmth. ? Pus or a bad smell. Activity  Do gentle, daily exercises as told by your health care provider. You may be told to take short walks every day and go farther each time.  Do not lift anything that is heavier than 10 lb (4.5 kg), or the limit that your health care provider tells you, until he or she says that it is safe.  Do not drive or use heavy machinery while taking prescription pain medicine.  Do not drive for 24 hours if you were given a medicine to help you relax (sedative).  Follow your health care provider's  instructions about exercise, driving, and general activities. Ask your health care provider what activities are safe for you. Lifestyle  Do not douche, use tampons, or have sex for at least 6 weeks or as told by your health care provider.  Do not drink alcohol until your health care provider approves.  Drink enough fluid to keep your urine clear or pale yellow.  Try to have someone at home with you for the first 1-2 weeks to help.  Do not use any products that contain nicotine or tobacco, such as cigarettes and e-cigarettes. These can delay healing. If you need help quitting, ask your health care provider. General instructions  Take over-the-counter and prescription medicines only as told by your health care provider.  Do not take aspirin or ibuprofen. These medicines can cause bleeding.  To prevent or treat constipation while you are taking prescription pain medicine, your health care provider may recommend that you: ? Drink enough fluid to keep your urine clear or pale yellow. ? Take over-the-counter or prescription medicines. ? Eat foods that are high in fiber, such as fresh fruits and vegetables, whole grains, and beans. ? Limit foods that are high in fat and processed sugars, such as fried and  sweet foods.  Keep all follow-up visits as told by your health care provider. This is important. Contact a health care provider if:  You have chills or fever.  You have redness, swelling, or pain around your incision.  You have fluid or blood coming from your incision.  Your incision feels warm to the touch.  You have pus or a bad smell coming from your incision.  Your incision breaks open.  You feel dizzy or light-headed.  You have pain or bleeding when you urinate.  You have persistent diarrhea.  You have persistent nausea and vomiting.  You have abnormal vaginal discharge.  You have a rash.  You have any type of abnormal reaction or you develop an allergy to your  medicine.  Your pain medicine does not help. Get help right away if:  You have a fever and your symptoms suddenly get worse.  You have severe abdominal pain.  You have shortness of breath.  You faint.  You have pain, swelling, or redness in your leg.  You have heavy vaginal bleeding with blood clots. Summary  After your procedure, it is common to have pain, fatigue and vaginal discharge.  Do not take baths, swim, or use a hot tub until your health care provider approves. Ask your health care provider if you can take showers. You may only be allowed to take sponge baths for bathing.  Follow your health care provider's instructions about exercise, driving, and general activities. Ask your health care provider what activities are safe for you.  Do not lift anything that is heavier than 10 lb (4.5 kg), or the limit that your health care provider tells you, until he or she says that it is safe.  Try to have someone at home with you for the first 1-2 weeks to help. This information is not intended to replace advice given to you by your health care provider. Make sure you discuss any questions you have with your health care provider. Document Released: 12/29/2004 Document Revised: 05/30/2016 Document Reviewed: 05/30/2016 Elsevier Interactive Patient Education  2017 [employer].

## 2017-04-08 ENCOUNTER — Encounter (HOSPITAL_COMMUNITY): Payer: Self-pay

## 2017-04-23 ENCOUNTER — Other Ambulatory Visit: Payer: Self-pay

## 2017-04-23 DIAGNOSIS — I83813 Varicose veins of bilateral lower extremities with pain: Secondary | ICD-10-CM

## 2017-04-23 DIAGNOSIS — M7989 Other specified soft tissue disorders: Secondary | ICD-10-CM

## 2017-04-25 ENCOUNTER — Encounter: Payer: Self-pay | Admitting: Student in an Organized Health Care Education/Training Program

## 2017-04-25 ENCOUNTER — Ambulatory Visit (INDEPENDENT_AMBULATORY_CARE_PROVIDER_SITE_OTHER): Payer: Self-pay | Admitting: Student in an Organized Health Care Education/Training Program

## 2017-04-25 VITALS — BP 100/60 | HR 73 | Temp 97.9°F | Ht 61.0 in | Wt 174.2 lb

## 2017-04-25 DIAGNOSIS — L509 Urticaria, unspecified: Secondary | ICD-10-CM

## 2017-04-25 DIAGNOSIS — I83893 Varicose veins of bilateral lower extremities with other complications: Secondary | ICD-10-CM

## 2017-04-25 DIAGNOSIS — Z0000 Encounter for general adult medical examination without abnormal findings: Secondary | ICD-10-CM | POA: Insufficient documentation

## 2017-04-25 NOTE — Progress Notes (Signed)
   CC: Hives  HPI: Julie [name] is a 51 y.o. female with PMH significant for abdominal hysterectomy on October 9 who presents to [facility] today with hives of 10 days duration.   HIVES Patient was in usual state of health until 10-12 days ago when she developed hives on her head, neck, abdomen and legs, and hands with generalized itching. Hives come and go every 2-3 days. Around 8 PM, lasts for 3 hours, then goes away. Has not taken any medication to make it better. Has just taken milk.  Surgery for abdominal hysterectomy was October 9. She just finished a course of percocet which was taken for 6 days.  - no new exposures; no new soaps, detergents or lotions - She changed her soap and the hives started  Referral requested for vascular vein  - for vericose veins. Already has an appointment, just requests a referral for insurance purposes.  Review of Symptoms:  See HPI for ROS.   CC, SH/smoking status, and VS noted.  Objective: BP 100/60   Pulse 73   Temp 97.9 F (36.6 C) (Oral)   Ht 5\' 1"  (1.549 m)   Wt 174 lb 3.2 oz (79 kg)   LMP 04/02/2017   SpO2 97%   BMI 32.91 kg/m  GEN: NAD, alert, cooperative, and pleasant. RESPIRATORY: clear to auscultation bilaterally with no wheezes, rhonchi or rales, good effort CV: RRR, no m/r/g, no peripheral edema SKIN: warm and dry, no rashes.  Well-healing surgical scar in lower abdomen is clean and dry PSYCH: AAOx3, appropriate affect, slightly anxious  Assessment and plan:  Hives Intermittent, only occurring some nights at 8PM and lasting for a few hours - Asked patient to discontinue use of new soap as this may be an irritant, especially because she reports taking baths at night sometimes when she notices the symptoms - benadryl PRN - keep a diary recording symptoms and potential irritants - return precautions advised - stop percocet from hysterectomy bc this can cause itching  Varicose veins of lower extremities with  complications Referral placed to vascular&vein specialists as requested  Healthcare maintenance Colonoscopy form provided.   Orders Placed This Encounter  Procedures  . Ambulatory referral to Vascular Surgery    Referral Priority:   Routine    Referral Type:   Surgical    Referral Reason:   Specialty Services Required    Requested Specialty:   Vascular Surgery    Number of Visits Requested:   1    Everrett Coombe, MD,MS,  PGY2 04/25/2017 11:26 AM

## 2017-04-25 NOTE — Assessment & Plan Note (Signed)
Referral placed to vascular&vein specialists as requested

## 2017-04-25 NOTE — Assessment & Plan Note (Addendum)
Intermittent, only occurring some nights at 8PM and lasting for a few hours - Asked patient to discontinue use of new soap as this may be an irritant, especially because she reports taking baths at night sometimes when she notices the symptoms - benadryl PRN - keep a diary recording symptoms and potential irritants - return precautions advised - stop percocet from hysterectomy bc this can cause itching

## 2017-04-25 NOTE — Assessment & Plan Note (Signed)
Colonoscopy form provided.

## 2017-04-25 NOTE — Patient Instructions (Addendum)
It was a pleasure seeing you today in our clinic. Today we discussed your hives. Here is the treatment plan we have discussed and agreed upon together: - Please keep a log when you develop hives of what you are eating, what is around you - you can use benadryl for itching - I would discontinue the percocet because that sometimes can cause itching  Our clinic's number is [phone]. Please call with questions or concerns about what we discussed today.  Be well, Dr. Burr Medico

## 2017-04-30 ENCOUNTER — Encounter: Payer: Self-pay | Admitting: Gastroenterology

## 2017-04-30 ENCOUNTER — Encounter (HOSPITAL_COMMUNITY): Payer: Self-pay

## 2017-05-01 ENCOUNTER — Encounter: Payer: Self-pay | Admitting: Obstetrics and Gynecology

## 2017-05-01 ENCOUNTER — Ambulatory Visit (INDEPENDENT_AMBULATORY_CARE_PROVIDER_SITE_OTHER): Payer: Medicaid Other | Admitting: Obstetrics and Gynecology

## 2017-05-01 VITALS — BP 126/67 | HR 64 | Wt 173.7 lb

## 2017-05-01 DIAGNOSIS — Z9889 Other specified postprocedural states: Secondary | ICD-10-CM

## 2017-05-01 NOTE — Progress Notes (Signed)
51 yo here for post op check following TAH on 04/02/2017. Patient reports feeling well since her surgery. She denies any fever, pain or abnormal drainage from her incision. She has gradually returned to her regular activities. Patient is without complaints  Past Medical History:  Diagnosis Date  . Anemia   . Bloated abdomen    post surgery   . Diarrhea   . Family history of adverse reaction to anesthesia    mother had problems with BP and passed away during surgery  . Gallstones   . Increased thirst    at night  . Nasal dryness    at night  . Nausea & vomiting   . Pre-diabetes   . Swelling of both lower extremities   . Varicose vein of leg    Past Surgical History:  Procedure Laterality Date  . CESAREAN SECTION    . CHOLECYSTECTOMY    . LAPAROSCOPIC CHOLECYSTECTOMY W/ CHOLANGIOGRAPHY  01/26/11  . TUBAL LIGATION     Family History  Problem Relation Age of Onset  . Cancer Mother        cervical  . Hypertension Mother    Social History   Tobacco Use  . Smoking status: Never Smoker  . Smokeless tobacco: Never Used  Substance Use Topics  . Alcohol use: Yes    Comment: occ  . Drug use: No   ROS See pertinent in HPI  Blood pressure 126/67, pulse 64, weight 173 lb 11.2 oz (78.8 kg), last menstrual period 04/02/2017. GENERAL: Well-developed, well-nourished female in no acute distress.  ABDOMEN: Soft, nontender, nondistended.  Incision: no erythema, induration or drainage PELVIC: Normal external female genitalia. Vagina is pink and rugated.  Vaginal vault is intact EXTREMITIES: No cyanosis, clubbing, or edema, 2+ distal pulses.  A/P 51 yo here for post op check - Pathology results reviewed with the patient - Patient is medically cleared to return to work on 11/19 - Patient with normal mammogram in January 2018 - RTC in 1 year or prn

## 2017-05-01 NOTE — Progress Notes (Signed)
C/o itching for about 10 days.

## 2017-05-02 ENCOUNTER — Encounter: Payer: Medicaid Other | Admitting: Vascular Surgery

## 2017-05-02 ENCOUNTER — Encounter: Payer: Self-pay | Admitting: Vascular Surgery

## 2017-05-02 ENCOUNTER — Ambulatory Visit (INDEPENDENT_AMBULATORY_CARE_PROVIDER_SITE_OTHER): Payer: Self-pay | Admitting: Vascular Surgery

## 2017-05-02 ENCOUNTER — Ambulatory Visit (HOSPITAL_COMMUNITY)
Admission: RE | Admit: 2017-05-02 | Discharge: 2017-05-02 | Disposition: A | Discharge status: Home / Self Care | Payer: Self-pay | Source: Ambulatory Visit | Attending: Vascular Surgery | Admitting: Vascular Surgery

## 2017-05-02 VITALS — BP 138/86 | HR 73 | Temp 98.2°F | Resp 16 | Ht 61.0 in | Wt 174.6 lb

## 2017-05-02 DIAGNOSIS — I83813 Varicose veins of bilateral lower extremities with pain: Secondary | ICD-10-CM | POA: Insufficient documentation

## 2017-05-02 DIAGNOSIS — M7989 Other specified soft tissue disorders: Secondary | ICD-10-CM | POA: Insufficient documentation

## 2017-05-02 NOTE — Progress Notes (Signed)
Patient is a 51 year old female who presents today for evaluation of symptomatic varicose veins with pain and swelling. She was seen by my partner Dr. Kellie Simmering in 2015. At that point she was given a prescription for lower extremity compression stockings. She continues to wear these. She states she gets some relief but minimal. She takes ibuprofen and elevates her legs to try to improve her symptoms but still has significant decreased her quality of life because of the pain and swelling. She was previously offered laser ablation but had some financial issues and was lost to follow-up. She now returns and has interest and possible laser ablation. She has struggled with her veins for about 15 years. They have slowly become larger and more painful. She has aching in her legs with progressive edema as the day progresses. She has no history of DVT.Marland Kitchen  The patient does not speak English and the interview was conducted through an interpreter provided by Zacarias Pontes today  Past Medical History:  Diagnosis Date  . Anemia   . Bloated abdomen    post surgery   . Diarrhea   . Family history of adverse reaction to anesthesia    mother had problems with BP and passed away during surgery  . Gallstones   . Increased thirst    at night  . Nasal dryness    at night  . Nausea & vomiting   . Pre-diabetes   . Swelling of both lower extremities   . Varicose vein of leg     Past Surgical History:  Procedure Laterality Date  . CESAREAN SECTION    . CHOLECYSTECTOMY    . LAPAROSCOPIC CHOLECYSTECTOMY W/ CHOLANGIOGRAPHY  01/26/11  . TUBAL LIGATION     Current Outpatient Medications on File Prior to Visit  Medication Sig Dispense Refill  . docusate sodium (COLACE) 100 MG capsule Take 1 capsule (100 mg total) by mouth 2 (two) times daily as needed. 30 capsule 2  . ferrous sulfate 325 (65 FE) MG tablet TAKE ONE TABLET BY MOUTH ONCE DAILY WITH BREAKFAST (Patient taking differently: TAKE ONE TABLET BY MOUTH ONCE DAILY BEFORE  BREAKFAST) 90 tablet 3  . ibuprofen (ADVIL,MOTRIN) 600 MG tablet Take 1 tablet (600 mg total) by mouth every 6 (six) hours as needed for fever or headache. 30 tablet 3  . Multiple Vitamin (MULTIVITAMIN) tablet Take 1 tablet by mouth daily with lunch.      No current facility-administered medications on file prior to visit.     No Known Allergies  Review of systems: She denies shortness of breath. She denies chest pain.  Physical exam:  Vitals:   05/02/17 1040  BP: 138/86  Pulse: 73  Resp: 16  Temp: 98.2 F (36.8 C)  TempSrc: Oral  SpO2: 98%  Weight: 174 lb 9.6 oz (79.2 kg)  Height: 5\' 1"  (1.549 m)    Neck: No carotid bruits new  Chest: Clear to auscultation bilaterally cardiac: Regular rate and rhythm without murmur  Abdomen: Soft nontender slightly obese  Extremities: 2+ dorsalis pedis pulses bilaterally. Large cluster of varicosities on the right medial calf with vein diameter 7-8 mm. Left calf medial area has similar varicosities although these are smaller about 4 mm in diameter. No ulcerations.  Data: Patient had a venous reflux exam today. This showed reflux in the common femoral and popliteal veins bilaterally. She also had fairly diffuse reflux in the greater saphenous vein. Vein diameter on the right was 7 mm. Left was 4-5 mm.  Assessment: Symptomatic  varicose veins with pain and swelling. CEP 3  Plan: The patient will continue leg elevation and ibuprofen for symptoms. She will also continue to wear compression stockings. We will see if we can get her approved for laser ablation.  Ruta Hinds, MD [facility] Office: [phone] Pager: [phone]

## 2017-05-08 ENCOUNTER — Encounter (HOSPITAL_COMMUNITY): Payer: Self-pay

## 2017-05-21 ENCOUNTER — Other Ambulatory Visit: Payer: Self-pay

## 2017-05-21 ENCOUNTER — Ambulatory Visit (AMBULATORY_SURGERY_CENTER): Payer: Self-pay

## 2017-05-21 VITALS — Ht 60.0 in | Wt 176.0 lb

## 2017-05-21 DIAGNOSIS — Z1211 Encounter for screening for malignant neoplasm of colon: Secondary | ICD-10-CM

## 2017-05-21 MED ORDER — NA SULFATE-K SULFATE-MG SULF 17.5-3.13-1.6 GM/177ML PO SOLN: 1.0000 | Freq: Once | ORAL | 0 refills | Status: AC

## 2017-05-21 NOTE — Progress Notes (Signed)
Denies allergies to eggs or soy products. Denies complication of anesthesia or sedation. Denies use of weight loss medication. Denies use of O2.   Emmi instructions declined.  Pre-Visit was conducted with the patients son translating instructions and medical information. Gregary Signs arrived later in the visit and re-iterated instructions.

## 2017-06-06 ENCOUNTER — Encounter: Payer: Self-pay | Admitting: Gastroenterology

## 2017-06-06 ENCOUNTER — Ambulatory Visit (AMBULATORY_SURGERY_CENTER): Payer: Medicaid Other | Admitting: Gastroenterology

## 2017-06-06 VITALS — BP 116/57 | HR 79 | Temp 97.7°F | Resp 17 | Ht 60.0 in | Wt 176.0 lb

## 2017-06-06 DIAGNOSIS — Z1211 Encounter for screening for malignant neoplasm of colon: Secondary | ICD-10-CM

## 2017-06-06 DIAGNOSIS — Z1212 Encounter for screening for malignant neoplasm of rectum: Secondary | ICD-10-CM

## 2017-06-06 MED ORDER — SODIUM CHLORIDE 0.9 % IV SOLN: 500.0000 mL | Freq: Once | INTRAVENOUS | Status: DC

## 2017-06-06 NOTE — Progress Notes (Signed)
Report given to PACU, vss 

## 2017-06-06 NOTE — Progress Notes (Signed)
Pt's states no medical or surgical changes since previsit or office visit. 

## 2017-06-06 NOTE — Op Note (Signed)
[facility] Patient Name: Julie [name] Procedure Date: 06/06/2017 2:00 PM MRN: [id number] Endoscopist: Mallie Mussel L. Loletha Carrow , MD Age: 51 Referring MD:  Date of Birth: 10/19/1965 Gender: Female Account #: [id number] Procedure:                Colonoscopy Indications:              Screening for colorectal malignant neoplasm, This                            is the patient's first colonoscopy Medicines:                Monitored Anesthesia Care Procedure:                Pre-Anesthesia Assessment:                           - Prior to the procedure, a History and Physical                            was performed, and patient medications and                            allergies were reviewed. The patient's tolerance of                            previous anesthesia was also reviewed. The risks                            and benefits of the procedure and the sedation                            options and risks were discussed with the patient.                            All questions were answered, and informed consent                            was obtained. Prior Anticoagulants: The patient has                            taken no previous anticoagulant or antiplatelet                            agents. ASA Grade Assessment: I - A normal, healthy                            patient. After reviewing the risks and benefits,                            the patient was deemed in satisfactory condition to                            undergo the procedure.  After obtaining informed consent, the colonoscope                            was passed under direct vision. Throughout the                            procedure, the patient's blood pressure, pulse, and                            oxygen saturations were monitored continuously. The                            Colonoscope was introduced through the anus and                            advanced to the the cecum,  identified by                            appendiceal orifice and ileocecal valve. The                            colonoscopy was performed without difficulty. The                            patient tolerated the procedure well. The quality                            of the bowel preparation was excellent. The                            ileocecal valve, appendiceal orifice, and rectum                            were photographed. The quality of the bowel                            preparation was evaluated using the BBPS [facility]                            Bowel Preparation Scale) with scores of: Right                            Colon = 3, Transverse Colon = 3 and Left Colon = 3                            (entire mucosa seen well with no residual staining,                            small fragments of stool or opaque liquid). The                            total BBPS score equals 9. The bowel preparation  used was SUPREP. Scope In: 2:11:43 PM Scope Out: 2:23:45 PM Scope Withdrawal Time: 0 hours 8 minutes 37 seconds  Total Procedure Duration: 0 hours 12 minutes 2 seconds  Findings:                 The perianal and digital rectal examinations were                            normal.                           The entire examined colon appeared normal on direct                            and retroflexion views. Complications:            No immediate complications. Estimated Blood Loss:     Estimated blood loss: none. Impression:               - The entire examined colon is normal on direct and                            retroflexion views.                           - No specimens collected. Recommendation:           - Patient has a contact number available for                            emergencies. The signs and symptoms of potential                            delayed complications were discussed with the                            patient. Return to normal  activities tomorrow.                            Written discharge instructions were provided to the                            patient.                           - Resume previous diet.                           - Continue present medications.                           - Repeat colonoscopy in 10 years for screening                            purposes. Sayre Mazor L. Loletha Carrow, MD 06/06/2017 2:26:27 PM This report has been signed electronically.

## 2017-06-06 NOTE — Patient Instructions (Signed)
YOU HAD AN ENDOSCOPIC PROCEDURE TODAY AT THE [facility]:   Refer to the procedure report that was given to you for any specific questions about what was found during the examination.  If the procedure report does not answer your questions, please call your gastroenterologist to clarify.  If you requested that your care partner not be given the details of your procedure findings, then the procedure report has been included in a sealed envelope for you to review at your convenience later.  YOU SHOULD EXPECT: Some feelings of bloating in the abdomen. Passage of more gas than usual.  Walking can help get rid of the air that was put into your GI tract during the procedure and reduce the bloating. If you had a lower endoscopy (such as a colonoscopy or flexible sigmoidoscopy) you may notice spotting of blood in your stool or on the toilet paper. If you underwent a bowel prep for your procedure, you may not have a normal bowel movement for a few days.  Please Note:  You might notice some irritation and congestion in your nose or some drainage.  This is from the oxygen used during your procedure.  There is no need for concern and it should clear up in a day or so.  SYMPTOMS TO REPORT IMMEDIATELY:   Following lower endoscopy (colonoscopy or flexible sigmoidoscopy):  Excessive amounts of blood in the stool  Significant tenderness or worsening of abdominal pains  Swelling of the abdomen that is new, acute  Fever of 100F or higher   For urgent or emergent issues, a gastroenterologist can be reached at any hour by calling [phone].   DIET:  We do recommend a small meal at first, but then you may proceed to your regular diet.  Drink plenty of fluids but you should avoid alcoholic beverages for 24 hours.  ACTIVITY:  You should plan to take it easy for the rest of today and you should NOT DRIVE or use heavy machinery until tomorrow (because of the sedation medicines used during the test).     FOLLOW UP: Our staff will call the number listed on your records the next business day following your procedure to check on you and address any questions or concerns that you may have regarding the information given to you following your procedure. If we do not reach you, we will leave a message.  However, if you are feeling well and you are not experiencing any problems, there is no need to return our call.  We will assume that you have returned to your regular daily activities without incident.  If any biopsies were taken you will be contacted by phone or by letter within the next 1-3 weeks.  Please call us at [phone] if you have not heard about the biopsies in 3 weeks.    SIGNATURES/CONFIDENTIALITY: You and/or your care partner have signed paperwork which will be entered into your electronic medical record.  These signatures attest to the fact that that the information above on your After Visit Summary has been reviewed and is understood.  Full responsibility of the confidentiality of this discharge information lies with you and/or your care-partner.   Resume medications.  

## 2017-06-07 ENCOUNTER — Ambulatory Visit (INDEPENDENT_AMBULATORY_CARE_PROVIDER_SITE_OTHER): Payer: Self-pay | Admitting: *Deleted

## 2017-06-07 ENCOUNTER — Telehealth: Payer: Self-pay | Admitting: *Deleted

## 2017-06-07 DIAGNOSIS — Z23 Encounter for immunization: Secondary | ICD-10-CM

## 2017-06-07 NOTE — Telephone Encounter (Signed)
  Follow up Call-  Call back number 06/06/2017  Post procedure Call Back phone  # [phone]  Permission to leave phone message Yes  Some recent data might be hidden     Patient questions:  Do you have a fever, pain , or abdominal swelling? No. Pain Score  0 *  Have you tolerated food without any problems? Yes.    Have you been able to return to your normal activities? Yes.    Do you have any questions about your discharge instructions: Diet   No. Medications  No. Follow up visit  No.  Do you have questions or concerns about your Care? No.  Actions: * If pain score is 4 or above: No action needed, pain <4.

## 2017-06-10 ENCOUNTER — Ambulatory Visit (INDEPENDENT_AMBULATORY_CARE_PROVIDER_SITE_OTHER): Payer: No Typology Code available for payment source | Admitting: Vascular Surgery

## 2017-06-10 ENCOUNTER — Encounter: Payer: Self-pay | Admitting: Vascular Surgery

## 2017-06-10 VITALS — BP 116/67 | HR 68 | Temp 97.4°F | Resp 16 | Ht 60.0 in | Wt 172.0 lb

## 2017-06-10 DIAGNOSIS — I83891 Varicose veins of right lower extremities with other complications: Secondary | ICD-10-CM

## 2017-06-10 NOTE — Progress Notes (Signed)
Subjective:     Patient ID: Julie [name], female   DOB: Jul 24, 1965, 51 y.o.   MRN: [id number]  HPI This 51 year old female returns today accompanied by a Spanish interpreter provided by [facility]. The patient does not speaking. She was seen recently by Dr. Oneida Alar and has been seen by me in the past. She develops aching throbbing burning discomfort as well as swelling in the right leg and would like treatment. 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  Past Medical History:  Diagnosis Date  . Anemia   . Bloated abdomen    post surgery   . Diarrhea   . Family history of adverse reaction to anesthesia    mother had problems with BP and passed away during surgery  . Gallstones   . Increased thirst    at night  . Nasal dryness    at night  . Nausea & vomiting   . Pre-diabetes   . Swelling of both lower extremities   . Varicose vein of leg     Social History   Tobacco Use  . Smoking status: Never Smoker  . Smokeless tobacco: Never Used  Substance Use Topics  . Alcohol use: No    Frequency: Never    Comment: occ    Family History  Problem Relation Age of Onset  . Cancer Mother        cervical  . Hypertension Mother   . Esophageal cancer Paternal Uncle   . Colon cancer Neg Hx   . Pancreatic cancer Neg Hx   . Rectal cancer Neg Hx   . Stomach cancer Neg Hx     No Known Allergies   Current Outpatient Medications:  .  ferrous sulfate 325 (65 FE) MG tablet, TAKE ONE TABLET BY MOUTH ONCE DAILY WITH BREAKFAST, Disp: 90 tablet, Rfl: 3 .  Multiple Vitamin (MULTIVITAMIN) tablet, Take 1 tablet by mouth daily with lunch. , Disp: , Rfl:   Vitals:   06/10/17 1348  BP: 116/67  Pulse: 68  Resp: 16  Temp: (!) 97.4 F (36.3 C)  TempSrc: Oral  SpO2: 100%  Weight: 172 lb (78 kg)  Height: 5' (1.524 m)    Body mass index is 33.59 kg/m.         Review of Systems Negative-denies chest pain, dyspnea on exertion,  hemoptysis    Objective:   Physical Exam BP 116/67 (BP Location: Left Arm, Patient Position: Sitting, Cuff Size: Large)   Pulse 68   Temp (!) 97.4 F (36.3 C) (Oral)   Resp 16   Ht 5' (1.524 m)   Wt 172 lb (78 kg)   LMP 04/02/2017   SpO2 100%   BMI 33.59 kg/m   Gen. well-developed well-nourished female no apparent distress alert and oriented 3 Lungs no rhonchi or wheezing Right leg with bulging varicosities in the medial calf down to the medial malleolar area of 1+ edema  Today I reviewed the venous duplex exam from last visit and also performed a bedside SonoSite ultrasound exam which confirms a very large caliber right great saphenous vein supplying these painful varicosities     Assessment:     Painful varicosities right leg with swellingCEAP3 causing symptoms which are affecting patient's daily living due to gross reflux right great saphenous vein    Plan:     Patient needs laser ablation right great saphenous vein +10-20 stab phlebectomy of painful varicosities We will proceed to do this  procedure for her in the near future and she will need an interpreter the time of the procedure

## 2017-06-11 ENCOUNTER — Ambulatory Visit: Payer: Medicaid Other | Admitting: Vascular Surgery

## 2017-06-13 ENCOUNTER — Other Ambulatory Visit: Payer: Self-pay | Admitting: *Deleted

## 2017-06-13 DIAGNOSIS — I83893 Varicose veins of bilateral lower extremities with other complications: Secondary | ICD-10-CM

## 2017-07-08 ENCOUNTER — Other Ambulatory Visit: Payer: Self-pay | Admitting: Obstetrics and Gynecology

## 2017-07-08 DIAGNOSIS — Z1231 Encounter for screening mammogram for malignant neoplasm of breast: Secondary | ICD-10-CM

## 2017-07-15 ENCOUNTER — Encounter: Payer: Self-pay | Admitting: Vascular Surgery

## 2017-07-15 ENCOUNTER — Ambulatory Visit (INDEPENDENT_AMBULATORY_CARE_PROVIDER_SITE_OTHER): Payer: No Typology Code available for payment source | Admitting: Vascular Surgery

## 2017-07-15 VITALS — BP 119/69 | HR 69 | Temp 97.4°F | Resp 18 | Ht 61.0 in | Wt 181.5 lb

## 2017-07-15 DIAGNOSIS — I83891 Varicose veins of right lower extremities with other complications: Secondary | ICD-10-CM

## 2017-07-15 HISTORY — PX: ENDOVENOUS ABLATION SAPHENOUS VEIN W/ LASER: SUR449

## 2017-07-15 NOTE — Progress Notes (Signed)
Subjective:     Patient ID: Julie [name], female   DOB: [birth year], 52 y.o.   MRN: [id number]  HPI This 52 year old female who is accompanied by an interpreter had laser ablation of the right great saphenous vein from the proximal calf to near the saphenofemoral junction +10-20 stab phlebectomy of painful varicosities performed under local tumescent anesthesia. A total of 2186 J of energy was utilized. She tolerated the procedures well.  Review of Systems     Objective:   Physical Exam BP 119/69 (BP Location: Left Arm, Patient Position: Sitting, Cuff Size: Large)   Pulse 69   Temp (!) 97.4 F (36.3 C) (Oral)   Resp 18   Ht 5\' 1"  (1.549 m)   Wt 181 lb 8 oz (82.3 kg)   LMP 04/02/2017   SpO2 99%   BMI 34.29 kg/m        Assessment:     Well-tolerated laser ablation right great saphenous vein plus multiple stab phlebectomy of painful varicosities performed under local tumescent anesthesia    Plan:     Return in 1 week for venous duplex exam to confirm closure right great saphenous vein and this will complete patient's treatment regimen

## 2017-07-15 NOTE — Progress Notes (Signed)
Laser Ablation Procedure    Date: 07/15/2017   [facility] Julie [name] DOB:08-May-1966  Consent signed: Yes    Surgeon:  Dr. Nelda Severe. Kellie Simmering  Procedure: Laser Ablation: right Greater Saphenous Vein  BP 119/69 (BP Location: Left Arm, Patient Position: Sitting, Cuff Size: Large)   Pulse 69   Temp (!) 97.4 F (36.3 C) (Oral)   Resp 18   Ht 5\' 1"  (1.549 m)   Wt 181 lb 8 oz (82.3 kg)   LMP 04/02/2017   SpO2 99%   BMI 34.29 kg/m   Tumescent Anesthesia: 450 cc 0.9% NaCl with 50 cc Lidocaine HCL 1% and 15 cc 8.4% NaHCO3  Local Anesthesia: 6 cc Lidocaine HCL and NaHCO3 (ratio 2:1)  Pulsed Mode: 15 watts, 52ms delay, 1.0 duration  Total Energy:  2186 Joules            Total Pulses: 147               Total Time: 2:26    Stab Phlebectomy: 10-20 Sites: Calf and Ankle  Patient tolerated procedure well  Notes: Spanish interpreter,Julie [name] of [employer], was in room prior to procedure for informed consents and instructions concerning procedure, during the entire procedure, and post procedure to explain all post op instructions and care.  Description of Procedure:  After marking the course of the secondary varicosities, the patient was placed on the operating table in the supine position, and the right leg was prepped and draped in sterile fashion.   Local anesthetic was administered and under ultrasound guidance the saphenous vein was accessed with a micro needle and guide wire; then the mirco puncture sheath was placed.  A guide wire was inserted saphenofemoral junction , followed by a 5 french sheath.  The position of the sheath and then the laser fiber below the junction was confirmed using the ultrasound.  Tumescent anesthesia was administered along the course of the saphenous vein using ultrasound guidance. The patient was placed in Trendelenburg position and protective laser glasses were placed on patient and staff, and the laser was fired at 15 watts continuous mode advancing 1-29mm/second for  a total of 2186 joules.   For stab phlebectomies, local anesthetic was administered at the previously marked varicosities, and tumescent anesthesia was administered around the vessels.  Ten to 20 stab wounds were made using the tip of an 11 blade. And using the vein hook, the phlebectomies were performed using a hemostat to avulse the varicosities.  Adequate hemostasis was achieved.     Steri strips were applied to the stab wounds and ABD pads and thigh high compression stockings were applied.  Ace wrap bandages were applied over the phlebectomy sites and at the top of the saphenofemoral junction. Blood loss was less than 15 cc.  The patient ambulated out of the operating room having tolerated the procedure well.

## 2017-07-23 ENCOUNTER — Ambulatory Visit (HOSPITAL_COMMUNITY)
Admission: RE | Admit: 2017-07-23 | Discharge: 2017-07-23 | Disposition: A | Discharge status: Home / Self Care | Payer: No Typology Code available for payment source | Source: Ambulatory Visit | Attending: Vascular Surgery | Admitting: Vascular Surgery

## 2017-07-23 ENCOUNTER — Ambulatory Visit (INDEPENDENT_AMBULATORY_CARE_PROVIDER_SITE_OTHER): Payer: No Typology Code available for payment source | Admitting: Vascular Surgery

## 2017-07-23 ENCOUNTER — Encounter: Payer: Self-pay | Admitting: Vascular Surgery

## 2017-07-23 VITALS — BP 118/69 | HR 67 | Temp 97.3°F | Resp 18 | Ht 61.0 in | Wt 181.4 lb

## 2017-07-23 DIAGNOSIS — I83891 Varicose veins of right lower extremities with other complications: Secondary | ICD-10-CM

## 2017-07-23 DIAGNOSIS — Z9889 Other specified postprocedural states: Secondary | ICD-10-CM | POA: Insufficient documentation

## 2017-07-23 DIAGNOSIS — I83893 Varicose veins of bilateral lower extremities with other complications: Secondary | ICD-10-CM | POA: Insufficient documentation

## 2017-07-23 NOTE — Progress Notes (Signed)
Subjective:     Patient ID: Julie [name], female   DOB: 03-27-1966, 52 y.o.   MRN: [id number]  HPI This 52 year old female returns 1 week post-laser ablation right great saphenous vein plus multiple stab phlebectomy painful varicosities. She has worn her elastic compression stockings and take an ibuprofen as instructed.She has had mild discomfort in the right medial thigh She's had  No edema. She denies chest pain, dyspnea on exertion, PND, orthopnea, hemoptysis  Review of Systems     Objective:   Physical Exam BP 118/69 (BP Location: Left Arm, Patient Position: Sitting, Cuff Size: Normal)   Pulse 67   Temp (!) 97.3 F (36.3 C) (Oral)   Resp 18   Ht 5\' 1"  (1.549 m)   Wt 181 lb 6.4 oz (82.3 kg)   LMP 04/02/2017   SpO2 96%   BMI 34.28 kg/m   Gen. Well-developed well-nourished female no apparent distress Lungs no rhonchi or wheezing Right leg with minimal bruising in thigh over great saphenous vein Stab phlebectoling nicely with Steri-Strips in place No distal edema  Today I ordered a venous duplex exam of the right leg which I reviewed and interpreted. There is no DVT. There is total closure of the right great saphenous vein from the distal thigh to near the saphenofemoral junction     Assessment:     successful laser ablation right great saphenous vein with multiple stab phlebectomy of painful varicosities    Plan:     Return to see me o a when necessary basis

## 2017-07-29 ENCOUNTER — Other Ambulatory Visit: Payer: Self-pay

## 2017-07-29 ENCOUNTER — Ambulatory Visit (INDEPENDENT_AMBULATORY_CARE_PROVIDER_SITE_OTHER)
Payer: No Typology Code available for payment source | Admitting: Student in an Organized Health Care Education/Training Program

## 2017-07-29 ENCOUNTER — Encounter: Payer: Self-pay | Admitting: Student in an Organized Health Care Education/Training Program

## 2017-07-29 VITALS — BP 112/68 | HR 78 | Temp 97.9°F | Ht 61.0 in | Wt 184.6 lb

## 2017-07-29 DIAGNOSIS — D649 Anemia, unspecified: Secondary | ICD-10-CM

## 2017-07-29 DIAGNOSIS — Z0000 Encounter for general adult medical examination without abnormal findings: Secondary | ICD-10-CM

## 2017-07-29 DIAGNOSIS — M25511 Pain in right shoulder: Secondary | ICD-10-CM

## 2017-07-29 MED ORDER — CYCLOBENZAPRINE HCL 5 MG PO TABS: 5.0000 mg | ORAL_TABLET | Freq: Every evening | ORAL | 0 refills | Status: DC | PRN

## 2017-07-29 NOTE — Patient Instructions (Signed)
It was a pleasure seeing you today in our clinic. Here is the treatment plan we have discussed and agreed upon together:  We drew blood work at today's visit. I will call or send you a letter with these results. If you do not hear from me within the next week, please give our office a call.  Our clinic's number is [phone]. Please call with questions or concerns about what we discussed today.  Be well, Dr. Milania Haubner   

## 2017-07-29 NOTE — Progress Notes (Signed)
   CC: Right arm pain  HPI: Julie [name] is a 52 y.o. 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 Not endorsing chest pain or palpitations or dyspnea.  Review of Symptoms:  See HPI for ROS.   CC, SH/smoking status, and VS noted.  Objective: BP 112/68   Pulse 78   Temp 97.9 F (36.6 C) (Oral)   Ht 5\' 1"  (1.549 m)   Wt 184 lb 9.6 oz (83.7 kg)   LMP 04/02/2017   SpO2 96%   BMI 34.88 kg/m  GEN: NAD, alert, cooperative, and pleasant. RESPIRATORY: clear to auscultation bilaterally with no wheezes, rhonchi or rales, good effort CV: RRR, no m/r/g, no peripheral edema GI: soft, non-tender, non-distended, no hepatosplenomegaly SKIN: warm and dry, no rashes or lesions EXT: RLE: Full ROM of the arm at the shoulder and the elbow, ttp over right deltoid. 5/5 strength RUE in all muscle groups. Negative spurlings. No cervical spine tenderness. Sensation intact bil UE. NEURO: II-XII grossly intact, normal gait, peripheral sensation intact PSYCH: AAOx3, appropriate affect  Assessment and plan:  Acute pain of right shoulder Pain seems to be MSK. No red flags on exam, no sensation or strength deficits, no evidence of infection or DVT. - flexeril 5 mg QHS PRN shoulder pain - ibuprofen and tylenol as needed - follow up precautions provided - f/u in 2 weeks if symptoms worsen or fail to improve.  Anemia History of anemia, and patient reports her iron supplement was previously discontinued by OBGYN. Her last Hgb check was 3 months ago and she is concerned that her Hgb may be low again due to  stopping iron. She is asymptomatic. No palpitations, dyspnea, or fatigue. No physical exam findings to suggest anemia. - check CBC. If Hgb normal, can space out checks.  Healthcare maintenance Patient requests lipid panel check. Given her age >36, a screening test is reasonable. - cholesterol resulted with elevated LDL. Asked CMA to have patient schedule an appointment to discuss starting a statin.   Orders Placed This Encounter  Procedures  . CBC  . Lipid panel    Order Specific Question:   Has the patient fasted?    Answer:   No    Meds ordered this encounter  Medications  . cyclobenzaprine (FLEXERIL) 5 MG tablet    Sig: Take 1 tablet (5 mg total) by mouth at bedtime as needed for muscle spasms.    Dispense:  20 tablet    Refill:  0    Everrett Coombe, MD,MS,  PGY2 07/31/2017 12:02 PM

## 2017-07-30 ENCOUNTER — Ambulatory Visit
Admission: RE | Admit: 2017-07-30 | Discharge: 2017-07-30 | Disposition: A | Discharge status: Home / Self Care | Payer: No Typology Code available for payment source | Source: Ambulatory Visit | Attending: Obstetrics and Gynecology | Admitting: Obstetrics and Gynecology

## 2017-07-30 ENCOUNTER — Encounter (HOSPITAL_COMMUNITY): Payer: Self-pay

## 2017-07-30 ENCOUNTER — Ambulatory Visit: Payer: No Typology Code available for payment source

## 2017-07-30 ENCOUNTER — Ambulatory Visit (HOSPITAL_COMMUNITY)
Admission: RE | Admit: 2017-07-30 | Discharge: 2017-07-30 | Disposition: A | Discharge status: Home / Self Care | Payer: No Typology Code available for payment source | Source: Ambulatory Visit | Attending: Obstetrics and Gynecology | Admitting: Obstetrics and Gynecology

## 2017-07-30 VITALS — BP 132/86 | Ht 61.0 in | Wt 182.0 lb

## 2017-07-30 DIAGNOSIS — M25511 Pain in right shoulder: Secondary | ICD-10-CM | POA: Insufficient documentation

## 2017-07-30 DIAGNOSIS — Z1239 Encounter for other screening for malignant neoplasm of breast: Secondary | ICD-10-CM

## 2017-07-30 DIAGNOSIS — Z1231 Encounter for screening mammogram for malignant neoplasm of breast: Secondary | ICD-10-CM

## 2017-07-30 LAB — CBC
HEMOGLOBIN: 12.4 g/dL (ref 11.1–15.9)
Hematocrit: 37.2 % (ref 34.0–46.6)
MCH: 28.1 pg (ref 26.6–33.0)
MCHC: 33.3 g/dL (ref 31.5–35.7)
MCV: 84 fL (ref 79–97)
Platelets: 327 10*3/uL (ref 150–379)
RBC: 4.41 x10E6/uL (ref 3.77–5.28)
RDW: 14.3 % (ref 12.3–15.4)
WBC: 6 10*3/uL (ref 3.4–10.8)

## 2017-07-30 LAB — LIPID PANEL
CHOL/HDL RATIO: 2.8 ratio (ref 0.0–4.4)
Cholesterol, Total: 197 mg/dL (ref 100–199)
HDL: 71 mg/dL (ref >39)
LDL CALC: 113 mg/dL — AB (ref 0–99)
TRIGLYCERIDES: 65 mg/dL (ref 0–149)
VLDL Cholesterol Cal: 13 mg/dL (ref 5–40)

## 2017-07-30 NOTE — Progress Notes (Signed)
No complaints today.   Pap Smear: Pap smear not completed today. Last Pap smear was 05/16/2016 at [facility] and normal with negative HPV. Per patient has a history of an abnormal Pap smear 11 years ago that a LEEP was completed for follow up. Patient had a hysterectomy completed 04/02/2017 for fibroids. Patient no longer needs Pap smears due to her history of a hysterectomy for benign reasons per BCCCP and ACOG guidelines. Last two Pap smear and hysterectomy results are in EPIC.  Physical exam: Breasts  Breasts symmetrical. No skin abnormalities bilateral breasts. No nipple retraction bilateral breasts. No nipple discharge bilateral breasts. No lymphadenopathy. No lumps palpated bilateral breasts. No complaints of pain or tenderness on exam. Referred patient to the [place] [facility] for a screening mammogram. Appointment scheduled for Tuesday, July 30, 2017 at 1040.        Pelvic/Bimanual No Pap smear completed today since patient has a history of a hysterectomy for benign reasons. Pap smear not indicated per BCCCP guidelines.   Smoking History: Patient has never smoked.  Patient Navigation: Patient education provided. Access to services provided for patient through [facility] program. Spanish interpreter provided.  Colorectal Cancer Screening: Patient had a colonoscopy completed 06/06/2017. No complaints today.  Used Spanish interpreter [employer] from [place].

## 2017-07-30 NOTE — Patient Instructions (Addendum)
Explained breast self awareness with [place]. Patient did not need a Pap smear today due to last Pap smear and HPV typing was 05/16/2016. Let her know BCCCP will cover Pap smears and HPV typing every 5 years unless has a history of abnormal Pap smears. Patient had a hysterectomy completed 04/02/2017 due to fibroids. Explained to patient that she no longer needs Pap smears due to her history of a hysterectomy for benign reasons. Referred patient to the [facility] for a screening mammogram. Appointment scheduled for Tuesday, July 30, 2017 at 1040. Let patient know the [facility] will follow up with her within the next couple weeks with results of mammogram by letter or phone. [place] verbalized understanding.  Jesse Nosbisch, Arvil [place], RN 8:56 AM

## 2017-07-30 NOTE — Assessment & Plan Note (Signed)
Pain seems to be MSK. No red flags on exam, no sensation or strength deficits, no evidence of infection or DVT. - flexeril 5 mg QHS PRN shoulder pain - ibuprofen and tylenol as needed - follow up precautions provided - f/u in 2 weeks if symptoms worsen or fail to improve.

## 2017-07-31 ENCOUNTER — Encounter: Payer: Self-pay | Admitting: Student in an Organized Health Care Education/Training Program

## 2017-07-31 ENCOUNTER — Encounter (HOSPITAL_COMMUNITY): Payer: Self-pay | Admitting: *Deleted

## 2017-07-31 DIAGNOSIS — D649 Anemia, unspecified: Secondary | ICD-10-CM | POA: Insufficient documentation

## 2017-07-31 NOTE — Assessment & Plan Note (Signed)
History of anemia, and patient reports her iron supplement was previously discontinued by OBGYN. Her last Hgb check was 3 months ago and she is concerned that her Hgb may be low again due to stopping iron. She is asymptomatic. No palpitations, dyspnea, or fatigue. No physical exam findings to suggest anemia. - check CBC. If Hgb normal, can space out checks.

## 2017-07-31 NOTE — Assessment & Plan Note (Signed)
Patient requests lipid panel check. Given her age >71, a screening test is reasonable. - cholesterol resulted with elevated LDL. Asked CMA to have patient schedule an appointment to discuss starting a statin.

## 2017-08-06 ENCOUNTER — Telehealth: Payer: Self-pay | Admitting: *Deleted

## 2017-08-06 NOTE — Telephone Encounter (Signed)
Julie Coombe, MD  P [facility]        Please ask patient to schedule an appointment to discuss cholesterol results because we may need to start a new medication for cholesterol.   Thank you,  Lauren    Contacted pt via [place], daughter Julie [name] answered the phone and I gave her the above message and told her that the doctor would go over this with the pt at her 08/16/17 appointment with Dr. Burr Medico.  She will give pt message. Katharina Caper, April D, [place]

## 2017-08-09 ENCOUNTER — Ambulatory Visit
Payer: No Typology Code available for payment source | Admitting: Student in an Organized Health Care Education/Training Program

## 2017-08-16 ENCOUNTER — Encounter: Payer: Self-pay | Admitting: Student in an Organized Health Care Education/Training Program

## 2017-08-16 ENCOUNTER — Ambulatory Visit (INDEPENDENT_AMBULATORY_CARE_PROVIDER_SITE_OTHER): Payer: Self-pay | Admitting: Student in an Organized Health Care Education/Training Program

## 2017-08-16 ENCOUNTER — Other Ambulatory Visit: Payer: Self-pay

## 2017-08-16 VITALS — BP 118/60 | HR 73 | Temp 98.1°F | Wt 184.0 lb

## 2017-08-16 DIAGNOSIS — R202 Paresthesia of skin: Secondary | ICD-10-CM | POA: Insufficient documentation

## 2017-08-16 DIAGNOSIS — M25511 Pain in right shoulder: Secondary | ICD-10-CM

## 2017-08-16 DIAGNOSIS — R200 Anesthesia of skin: Secondary | ICD-10-CM | POA: Insufficient documentation

## 2017-08-16 DIAGNOSIS — Z0000 Encounter for general adult medical examination without abnormal findings: Secondary | ICD-10-CM

## 2017-08-16 MED ORDER — CYCLOBENZAPRINE HCL 5 MG PO TABS: 5.0000 mg | ORAL_TABLET | Freq: Every evening | ORAL | 0 refills | Status: DC | PRN

## 2017-08-16 MED FILL — ?CYCLOBENZAPRINE 5MG TABLET: 5 | 20 days supply | Qty: 20 | Fill #0

## 2017-08-16 NOTE — Assessment & Plan Note (Addendum)
Physical exam and history are not consistent with carpal tunnel at this time.  Uncertain etiology of patient's hand falling asleep.  It may be that she has a vitamin B12 deficiency.  We can check his level today.  She can start a multivitamin.  Her husband asks about testing uric acid, however I do not see any red flags for gout, and I explained this to him, he is understanding.

## 2017-08-16 NOTE — Assessment & Plan Note (Signed)
Most consistent with musculoskeletal etiology.  No red flags on history or exam.  Patient was unable to obtain Flexeril from last visit.  Printed prescription for her to take to the pharmacy for this visit.  Advised her to only take it at bedtime and not before driving. -Follow-up in 2-4 weeks for symptoms if they worsen or fail to improve

## 2017-08-16 NOTE — Patient Instructions (Addendum)
It was a pleasure seeing you today in our clinic. Today we discussed lab results and arm pain. Here is the treatment plan we have discussed and agreed upon together:  We drew blood work at today's visit. I will call or send you a letter with these results. If you do not hear from me within the next week, please give our office a call.  Your prescription is attached to this sheet.  Our clinic's number is [phone]. Please call with questions or concerns about what we discussed today.  Be well, Dr. Burr Medico

## 2017-08-16 NOTE — Progress Notes (Signed)
   CC: Right shoulder pain  HPI: Julie [name] is a 52 y.o. female  who presents to [facility] today for follow up lab results, continued right shoulder pain.  Lab results Previously seen 2 weeks ago and labs were drawn, lipid panel not suggestive that we should start a statin based on ASCVD risk algorithm (1.3% risk for event in next 10 years). Discussed w/patient.  Right shoulder pain -patient was previously evaluated her at her last visit and thought to have musculoskeletal shoulder pain/muscle spasm. Patient endorses continued right-sided upper trapezius and deltoid pain that is worst at night and improves throughout the day.  She reports taking ibuprofen which helps with the pain.  She was previously given Flexeril at her last visit, however she did not fill this because she feels it went to the wrong pharmacy.   Bilateral hand numbness Patient endorses bilateral hand numbness that bothers her most at night.  She reports that her entire hands get numb on either side up to the wrist.  No wrist pain.  No isolated joint pain.  No redness or swelling.  Review of Symptoms:  See HPI for ROS.   CC, SH/smoking status, and VS noted.  Objective: BP 118/60   Pulse 73   Temp 98.1 F (36.7 C) (Oral)   Wt 184 lb (83.5 kg)   LMP 04/02/2017   SpO2 97%   BMI 34.77 kg/m  GEN: NAD, alert, cooperative, and pleasant. no pharyngeal erythema or exudates NECK: full ROM, negative Spurling, no point tenderness  EXT: Negative tinels and phalens. No wrist or finger joint swelling, full ROM at wrists and finger joints. No joint redness, swelling.  SHOULDER: +ttp over deltoid and superior trapezius. Full active and passive ROM at the shoulder and at the elbow, 5/5 strength UE bil in all muscle groups.  NEURO: II-XII grossly intact, peripheral sensation intact PSYCH: AAOx3, appropriate affect  Assessment and plan:  Acute pain of right shoulder Most consistent with musculoskeletal etiology.  No red  flags on history or exam.  Patient was unable to obtain Flexeril from last visit.  Printed prescription for her to take to the pharmacy for this visit.  Advised her to only take it at bedtime and not before driving. -Follow-up in 2-4 weeks for symptoms if they worsen or fail to improve  Numbness and tingling Physical exam and history are not consistent with carpal tunnel at this time.  Uncertain etiology of patient's hand falling asleep.  It may be that she has a vitamin B12 deficiency.  We can check his level today.  She can start a multivitamin.  Her husband asks about testing uric acid, however I do not see any red flags for gout, and I explained this to him, he is understanding.   Orders Placed This Encounter  Procedures  . Vitamin B12    Meds ordered this encounter  Medications  . cyclobenzaprine (FLEXERIL) 5 MG tablet    Sig: Take 1 tablet (5 mg total) by mouth at bedtime as needed for muscle spasms.    Dispense:  20 tablet    Refill:  0    Everrett Coombe, MD,MS,  PGY2 08/16/2017 12:23 PM

## 2017-08-17 LAB — VITAMIN B12: VITAMIN B 12: 1392 pg/mL — AB (ref 232–1245)

## 2017-08-18 ENCOUNTER — Encounter: Payer: Self-pay | Admitting: Student in an Organized Health Care Education/Training Program

## 2018-02-14 ENCOUNTER — Encounter

## 2018-02-27 ENCOUNTER — Encounter (INDEPENDENT_AMBULATORY_CARE_PROVIDER_SITE_OTHER): Payer: Self-pay | Admitting: Physician Assistant

## 2018-02-27 ENCOUNTER — Other Ambulatory Visit: Payer: Self-pay

## 2018-02-27 ENCOUNTER — Ambulatory Visit (INDEPENDENT_AMBULATORY_CARE_PROVIDER_SITE_OTHER): Payer: Self-pay | Admitting: Physician Assistant

## 2018-02-27 VITALS — BP 106/71 | HR 68 | Temp 97.9°F | Ht 61.0 in | Wt 184.4 lb

## 2018-02-27 DIAGNOSIS — Z131 Encounter for screening for diabetes mellitus: Secondary | ICD-10-CM

## 2018-02-27 DIAGNOSIS — Z23 Encounter for immunization: Secondary | ICD-10-CM

## 2018-02-27 DIAGNOSIS — Z114 Encounter for screening for human immunodeficiency virus [HIV]: Secondary | ICD-10-CM

## 2018-02-27 DIAGNOSIS — R600 Localized edema: Secondary | ICD-10-CM

## 2018-02-27 DIAGNOSIS — S161XXA Strain of muscle, fascia and tendon at neck level, initial encounter: Secondary | ICD-10-CM

## 2018-02-27 DIAGNOSIS — E7841 Elevated Lipoprotein(a): Secondary | ICD-10-CM

## 2018-02-27 LAB — POCT GLYCOSYLATED HEMOGLOBIN (HGB A1C): Hemoglobin A1C: 5.3 % (ref 4.0–5.6)

## 2018-02-27 MED ORDER — NAPROXEN 500 MG PO TABS: 500.0000 mg | ORAL_TABLET | Freq: Two times a day (BID) | ORAL | 0 refills | Status: DC

## 2018-02-27 MED ORDER — CYCLOBENZAPRINE HCL 10 MG PO TABS: 10.0000 mg | ORAL_TABLET | Freq: Every day | ORAL | 0 refills | Status: DC

## 2018-02-27 MED FILL — CYCLOBENZAPRINE 10 MG TAB: 10 | 10 days supply | Qty: 10 | Fill #0

## 2018-02-27 MED FILL — NAPROXEN 500 MG TABLET: 500 | 15 days supply | Qty: 30 | Fill #0

## 2018-02-27 NOTE — Patient Instructions (Signed)
Ejercicios para el cuello Neck Exercises Los ejercicios para el cuello pueden ser importantes por muchos motivos:  Pueden ayudarlo a mejorar y Theatre manager la flexibilidad del cuello. Esto puede ser sumamente importante a medida que envejece.  Pueden ayudarlo a fortalecer el cuello. Esto facilitar los movimientos.  Pueden reducir o Information systems manager de cuello.  Pueden ayudar a [employer] parte superior de la espalda.  Hable con su mdico acerca de los ejercicios para el cuello que son recomendables para usted. Ejercicios Presin con el cuello Repita este ejercicio 10veces. Hgalo apenas se despierte por la maana y justo antes de [place], o como se lo haya indicado el mdico. 1. Recustese sobre su espalda en una cama firme o en el suelo, y coloque una almohada debajo de la cabeza. 2. Use los msculos del cuello para presionar la cabeza contra la almohada y enderezar la columna vertebral. 3. Mantenga la posicin lo mejor que pueda. Mantenga la cabeza hacia arriba y el mentn [place]. 4. Cuente lentamente hasta 62mentras mantiene esta posicin. 5. Relaje algunos segundos. Repita.  Fortalecimiento isomtrico HEngineer, productionserie completa de ejercicios 2veces al da o como se lo haya indicado el mdico. 1. Sintese en una silla con buen a38y coloque una mano sobre la frente. 2. Empuje hacia adelante con la cabeza y el cuello mientras que con la mano ejerce cierta presin hacia el lado contrario. Mantenga esta posicin durante 10segundos. 3. Reljese. Repita el ejercicio 3veces. 4. A continuacin, vuelva a realizar la secuencia, esta vez poniendo la mano contra la nuca. Use la cabeza y el cuello para empujar en la direccin contraria a la presin que ejerce con la mano. 5. Para terminar, haga el mismo ejercicio en cada lado de la cabeza, empujando hacia los lados en la direccin contraria a la presin de lInsurance risk surveyor  Levantamiento de la cabeza desde la posicin decbito  prono Repita este ejercicio 5veces. Hgalo 2veces al da o como se lo haya indicado el mdico. 1. Acustese boca abajo y apoye los codos de modo que el pecho y la parte superior de la espalda se eleven. 2. Comience con la cabeza mirando hacia abajo, cerca del pecho. Coloque el mentn cerca del pecho o [place] 3. Eleve lentamente la cabeza. Hgalo hasta quedar mirando hacia adelante. Contine elevando y estirando la cabeza hacia atrs lo ms que pueda. 4. Mantenga la cabeza elevada durante 5segundos. A continuacin, bjela lentamente hasta la posicin inicial.  Levantamiento de la cabeza desde la posicin decbito supino Repita este ejercicio de 8 a 10veces. Hgalo 2veces al da o como se lo haya indicado el mdico. 1. Acustese sobre la espalda, doble las rodillas apuntando hacia el techo y mRohm and Haaspies apoyados en el piso. 2. Levante lentamente la cabeza del suelo, y eleve el mentn hacia el pCouncil 3. Mantenga esta posicin durante 5segundos. 4. Reljese y luego repita el ejercicio.  Retraccin escapular Repita este ejercicio 5veces. Hgalo 2veces al da o como se lo haya indicado el mdico. 1. Prese con los brazos a los costados. Mire hacia adelante. 2. Lentamente, lleve los hombros hacia atrs y hacia abajo hasta sentir que la zona de los omplatos, en la parte alta de la espalda, se estira. 3. Mantenga esta posicin durante 10 a 30segundos. 4. Reljese y luego repita el ejercicio.  Comunquese con un mdico si:  El dolor o las molestias en el cuello se vuelven mucho ms intensos cuando hace un ejercicio.  El dolor  estira.  3. Mantenga esta posición durante 10 a 30 segundos.  4. Relájese y luego repita el ejercicio.    Comuníquese con un médico si:  · El dolor o las molestias en el cuello se vuelven mucho más intensos cuando hace un ejercicio.  · El dolor o las molestias en el cuello no mejoran en el término de las 2 horas posteriores a hacer los ejercicios.  Si tiene alguno de estos problemas, deje de hacer los ejercicios de inmediato. No vuelva a hacer los ejercicios a menos que el médico lo autorice.  Solicite ayuda de inmediato si:   · Siente un dolor súbito e intenso en el cuello. Si esto ocurre, deje de hacer los ejercicios de inmediato. No vuelva a hacer los ejercicios a menos que el médico lo autorice.  Ejercicios  Estiramiento de cuello    Repita este ejercicio de 3 a 5 veces.  1. Haga este ejercicio de pie o sentado en una silla.  2. Apoye los pies en el piso, con una separación del ancho de los hombros.  3. Gire lentamente la cabeza hacia la derecha. Gírela completamente hacia la derecha de modo que pueda ver por encima de su hombro derecho. No incline ni ladee la cabeza.  4. Mantenga esta posición durante 10 a 30 segundos.  5. Gire lentamente la cabeza hacia la izquierda, hasta poder ver por encima de su hombro izquierdo.  6. Mantenga esta posición durante 10 a 30 segundos.    Retracción del cuello  Repita este ejercicio de 8 a 10 veces. Hágalo 3 o 4 veces al día, o como se lo haya indicado el médico.  1. Haga este ejercicio de pie o sentado en una silla firme.  2. Mire hacia adelante. No doble el cuello.  3. Use los dedos para empujar la barbilla hacia atrás. No doble el cuello al realizar este movimiento. Siga mirando hacia adelante. Si hace el ejercicio correctamente, tendrá una leve sensación en la garganta y sentirá que la nuca se estira.  4. Mantenga la elongación durante 1 o 2 segundos. Relájese y luego repita el ejercicio.    Esta información no tiene como fin reemplazar el consejo del médico. Asegúrese de hacerle al médico cualquier pregunta que tenga.  Document Released: 07/08/2015 Document Revised: 09/14/2016 Document Reviewed: 12/20/2014  Elsevier Interactive Patient Education © 2018 Elsevier Inc.

## 2018-02-27 NOTE — Progress Notes (Signed)
Subjective:  Patient ID: Julie [name], female    DOB: Feb 02, 1966  Age: 52 y.o. MRN: [id number]  CC: swollen legs  HPI Julie [name] is a 52 y.o. female with a medical history of anemia, pre-diabetes, HLD, bilateral lower extremity edema, s/p hysterectomy, and varicosities presents as a new pt for bilateral LE edema. Feels that her ankles swell at the end of the day after prolonged walking and standing. Wakes up after a night of sleep and the edema is gone. Had previously worn compression stockings for varicosities which helped with the edema. Had laser and injection treatment for varicosities which helped reduce the varicosities but not the edema. Denies issues with blood pressure. Does not endorse CP, palpitations, SOB, HA, tingling, numbness, abdominal pain, rash, or GI/GU sxs.    Says she has right shoulder/neck pain since yesterday. Had a large dog run up to her and she fell with [name] tucked to her right flank. Feeling pain along the right upper trapezius. Has not taken any thing for relief. Denies limited aROM of the right shoulder, swelling, redness, or bruising.    Outpatient Medications Prior to Visit  Medication Sig Dispense Refill  . ferrous sulfate 325 (65 FE) MG tablet TAKE ONE TABLET BY MOUTH ONCE DAILY WITH BREAKFAST (Patient not taking: Reported on 07/30/2017) 90 tablet 3  . Multiple Vitamin (MULTIVITAMIN) tablet Take 1 tablet by mouth daily with lunch.     . cyclobenzaprine (FLEXERIL) 5 MG tablet Take 1 tablet (5 mg total) by mouth at bedtime as needed for muscle spasms. 20 tablet 0   No facility-administered medications prior to visit.      ROS Review of Systems  Constitutional: Negative for chills, fever and malaise/fatigue.  Eyes: Negative for blurred vision.  Respiratory: Negative for shortness of breath.   Cardiovascular: Negative for chest pain and palpitations.  Gastrointestinal: Negative for abdominal pain and nausea.  Genitourinary: Negative for  dysuria and hematuria.  Musculoskeletal: Negative for joint pain and myalgias.  Skin: Negative for rash.  Neurological: Negative for tingling and headaches.  Psychiatric/Behavioral: Negative for depression. The patient is not nervous/anxious.     Objective:  Ht 5\' 1"  (1.549 m)   Wt 184 lb 6.4 oz (83.6 kg)   LMP 04/02/2017   BMI 34.84 kg/m   Vitals:   02/27/18 1515  BP: 106/71  Pulse: 68  Temp: 97.9 F (36.6 C)  TempSrc: Oral  SpO2: 97%  Weight: 184 lb 6.4 oz (83.6 kg)  Height: 5\' 1"  (1.549 m)     Physical Exam  Constitutional: She is oriented to person, place, and time.  Well developed, obese, NAD, polite  HENT:  Head: Normocephalic and atraumatic.  Eyes: No scleral icterus.  Neck: Normal range of motion. Neck supple. No thyromegaly present.  Cardiovascular: Normal rate, regular rhythm and normal heart sounds.  No LE edema bilaterally  Pulmonary/Chest: Effort normal and breath sounds normal.  Abdominal: Soft. Bowel sounds are normal. There is no tenderness.  No hepatosplenomegaly  Musculoskeletal: She exhibits no edema.  Increased muscular tonicity of the right and left upper trapezius. Full aROM of the neck.  Neurological: She is alert and oriented to person, place, and time. She displays normal reflexes. No cranial nerve deficit. She exhibits normal muscle tone. Coordination normal.  Skin: Skin is warm and dry. No rash noted. No erythema. No pallor.  Psychiatric: She has a normal mood and affect. Her behavior is normal. Thought content normal.  Vitals reviewed.  Assessment & Plan:    1. Bilateral lower extremity edema - Comprehensive metabolic panel - CBC with Differential - TSH - Sedimentation Rate - C-reactive protein - Brain natriuretic peptide  2. Strain of neck muscle, initial encounter - Begin naproxen (NAPROSYN) 500 MG tablet; Take 1 tablet (500 mg total) by mouth 2 (two) times daily with a meal.  Dispense: 30 tablet; Refill: 0 - Begin  cyclobenzaprine (FLEXERIL) 10 MG tablet; Take 1 tablet (10 mg total) by mouth at bedtime.  Dispense: 10 tablet; Refill: 0  3. Elevated lipoprotein(a - Lipid panel; Future  4. Screening for diabetes mellitus - HgB A1c 5.3% today  5. Need for Tdap vaccination - Tdap vaccine greater than or equal to 7yo IM  6. Need for influenza vaccination - Flu Vaccine QUAD 6+ mos PF IM (Fluarix Quad PF)  7. Screening for HIV (human immunodeficiency virus) - HIV antibody   Meds ordered this encounter  Medications  . naproxen (NAPROSYN) 500 MG tablet    Sig: Take 1 tablet (500 mg total) by mouth 2 (two) times daily with a meal.    Dispense:  30 tablet    Refill:  0    Order Specific Question:   Supervising Provider    Answer:   Charlott Rakes [[id number]]  . cyclobenzaprine (FLEXERIL) 10 MG tablet    Sig: Take 1 tablet (10 mg total) by mouth at bedtime.    Dispense:  10 tablet    Refill:  0    Order Specific Question:   Supervising Provider    Answer:   Charlott Rakes [[id number]]    Follow-up: Return in about 4 weeks (around 03/27/2018) for Bilateral lower extremity edema.   Clent Demark PA

## 2018-02-28 ENCOUNTER — Telehealth (INDEPENDENT_AMBULATORY_CARE_PROVIDER_SITE_OTHER): Payer: Self-pay

## 2018-02-28 LAB — HIV ANTIBODY (ROUTINE TESTING W REFLEX): HIV SCREEN 4TH GENERATION: NONREACTIVE

## 2018-02-28 LAB — COMPREHENSIVE METABOLIC PANEL
A/G RATIO: 2.1 (ref 1.2–2.2)
ALK PHOS: 195 IU/L — AB (ref 39–117)
ALT: 44 IU/L — AB (ref 0–32)
AST: 39 IU/L (ref 0–40)
Albumin: 5 g/dL (ref 3.5–5.5)
BUN/Creatinine Ratio: 23 (ref 9–23)
BUN: 13 mg/dL (ref 6–24)
Bilirubin Total: <0.2 mg/dL (ref 0.0–1.2)
CO2: 24 mmol/L (ref 20–29)
Calcium: 10.2 mg/dL (ref 8.7–10.2)
Chloride: 103 mmol/L (ref 96–106)
Creatinine, Ser: 0.56 mg/dL — ABNORMAL LOW (ref 0.57–1.00)
GFR calc Af Amer: 123 mL/min/{1.73_m2} (ref >59)
GFR calc non Af Amer: 107 mL/min/{1.73_m2} (ref >59)
GLOBULIN, TOTAL: 2.4 g/dL (ref 1.5–4.5)
Glucose: 135 mg/dL — ABNORMAL HIGH (ref 65–99)
POTASSIUM: 4.1 mmol/L (ref 3.5–5.2)
SODIUM: 143 mmol/L (ref 134–144)
Total Protein: 7.4 g/dL (ref 6.0–8.5)

## 2018-02-28 LAB — CBC WITH DIFFERENTIAL/PLATELET
BASOS: 1 %
Basophils Absolute: 0 10*3/uL (ref 0.0–0.2)
EOS (ABSOLUTE): 0.1 10*3/uL (ref 0.0–0.4)
EOS: 2 %
Hematocrit: 40.5 % (ref 34.0–46.6)
Hemoglobin: 13.7 g/dL (ref 11.1–15.9)
Immature Grans (Abs): 0 10*3/uL (ref 0.0–0.1)
Immature Granulocytes: 0 %
LYMPHS ABS: 1.8 10*3/uL (ref 0.7–3.1)
Lymphs: 37 %
MCH: 28.2 pg (ref 26.6–33.0)
MCHC: 33.8 g/dL (ref 31.5–35.7)
MCV: 83 fL (ref 79–97)
Monocytes Absolute: 0.4 10*3/uL (ref 0.1–0.9)
Monocytes: 9 %
NEUTROS ABS: 2.5 10*3/uL (ref 1.4–7.0)
Neutrophils: 51 %
PLATELETS: 292 10*3/uL (ref 150–450)
RBC: 4.86 x10E6/uL (ref 3.77–5.28)
RDW: 13.1 % (ref 12.3–15.4)
WBC: 4.9 10*3/uL (ref 3.4–10.8)

## 2018-02-28 LAB — SEDIMENTATION RATE: Sed Rate: 24 mm/hr (ref 0–40)

## 2018-02-28 LAB — TSH: TSH: 1.95 u[IU]/mL (ref 0.450–4.500)

## 2018-02-28 LAB — C-REACTIVE PROTEIN: CRP: 1 mg/L (ref 0–10)

## 2018-02-28 LAB — BRAIN NATRIURETIC PEPTIDE

## 2018-02-28 NOTE — Telephone Encounter (Signed)
-----   Message from Clent Demark, PA-C sent at 02/28/2018  2:03 PM EDT ----- Normal labs except for mild liver inflammation likely due to fatty liver. Please watch diet and consume less sweets and fats.

## 2018-02-28 NOTE — Telephone Encounter (Signed)
Call placed using pacific interpreter [phone]) patient aware that labs are normal except mild liver inflammation likely due to fatty liver. Advised to watch her diet and eat less sweets and fats. Patient expressed understanding. Nat Christen, CMA

## 2018-03-06 ENCOUNTER — Other Ambulatory Visit (INDEPENDENT_AMBULATORY_CARE_PROVIDER_SITE_OTHER): Payer: Self-pay

## 2018-03-06 NOTE — Addendum Note (Signed)
Addended by: Renaldo Reel on: 03/06/2018 09:07 AM   Modules accepted: Orders

## 2018-03-06 NOTE — Progress Notes (Signed)
Labs collected by onsite [facility] phlebotomist. Niki Payment S Luciano Cinquemani, CMA  

## 2018-03-07 ENCOUNTER — Other Ambulatory Visit (INDEPENDENT_AMBULATORY_CARE_PROVIDER_SITE_OTHER): Payer: Self-pay | Admitting: Physician Assistant

## 2018-03-07 ENCOUNTER — Telehealth (INDEPENDENT_AMBULATORY_CARE_PROVIDER_SITE_OTHER): Payer: Self-pay

## 2018-03-07 DIAGNOSIS — E7841 Elevated Lipoprotein(a): Secondary | ICD-10-CM

## 2018-03-07 LAB — LIPID PANEL
CHOL/HDL RATIO: 3.2 ratio (ref 0.0–4.4)
Cholesterol, Total: 195 mg/dL (ref 100–199)
HDL: 61 mg/dL (ref >39)
LDL CALC: 123 mg/dL — AB (ref 0–99)
TRIGLYCERIDES: 55 mg/dL (ref 0–149)
VLDL Cholesterol Cal: 11 mg/dL (ref 5–40)

## 2018-03-07 MED ORDER — ATORVASTATIN CALCIUM 40 MG PO TABS: 40.0000 mg | ORAL_TABLET | Freq: Every day | ORAL | 6 refills | Status: DC

## 2018-03-07 NOTE — Telephone Encounter (Signed)
-----   Message from Clent Demark, PA-C sent at 03/07/2018  1:11 PM EDT ----- Cholesterol elevated. I have sent atorvastatin to [facility].

## 2018-03-07 NOTE — Telephone Encounter (Signed)
Call placed using pacific interpreter [phone]) left voicemail notifying patient that cholesterol is elevated and atorvastatin has been sent [facility] pharmacy to help reduce cholesterol. Call [facility] with any questions or concerns. Nat Christen, CMA

## 2018-03-10 ENCOUNTER — Ambulatory Visit: Payer: No Typology Code available for payment source

## 2018-03-27 ENCOUNTER — Encounter (INDEPENDENT_AMBULATORY_CARE_PROVIDER_SITE_OTHER): Payer: Self-pay | Admitting: Physician Assistant

## 2018-03-27 ENCOUNTER — Ambulatory Visit (INDEPENDENT_AMBULATORY_CARE_PROVIDER_SITE_OTHER): Payer: Self-pay | Admitting: Physician Assistant

## 2018-03-27 VITALS — BP 121/76 | HR 60 | Temp 97.6°F | Ht 61.0 in | Wt 179.0 lb

## 2018-03-27 DIAGNOSIS — R748 Abnormal levels of other serum enzymes: Secondary | ICD-10-CM

## 2018-03-27 NOTE — Patient Instructions (Signed)
Hgado graso (Fatty Liver) El hgado graso, tambin llamado esteatosis heptica o esteatohepatitis, es una enfermedad en la que se acumula demasiada grasa en las clulas del hgado. El hgado elimina las sustancias dainas del torrente sanguneo y produce lquidos que el cuerpo necesita. Tambin ayuda al cuerpo a utilizar y almacenar la energa obtenida de los alimentos que come. En muchos casos, el hgado graso no provoca sntomas ni problemas. Con frecuencia, se diagnostica cuando se realizan estudios por otros motivos. Sin embargo, con el tiempo, el hgado graso puede provocar una inflamacin que puede causar problemas hepticos ms graves, como fibrosis heptica (cirrosis). CAUSAS Las causas del hgado graso pueden incluir las siguientes:  Beber alcohol en exceso.  Dficit nutricional.  Obesidad.  Sndrome de [place].  Diabetes.  Hiperlipidemia.  Embarazo.  Determinados medicamentos.  Txicos.  Algunas infecciones virales. FACTORES DE RIESGO Puede tener ms probabilidades de tener hgado graso si:  Consume alcohol en exceso.  Est embarazada.  Tiene sobrepeso.  Tiene diabetes.  Tiene hepatitis.  Tiene un nivel alto de triglicridos. SIGNOS Y SNTOMAS El hgado graso con frecuencia no provoca sntomas. En los casos en que se presentan sntomas, estos pueden incluir:  Fatiga.  Debilidad.  Prdida de peso.  Confusin.  Dolor abdominal.  Color amarillo en la piel y en la zona blanca de los ojos (ictericia).  Nuseas y vmitos. DIAGNSTICO El hgado graso se puede diagnosticar mediante lo siguiente:  Un examen fsico y una historia clnica.  Anlisis de sangre.  Estudios de diagnstico por imgenes, como ecografa, tomografa computarizada (TC) o resonancia magntica (RM).  Biopsia de hgado. Se extrae una pequea muestra de tejido del hgado usando una aguja. La muestra se examina en el microscopio. TRATAMIENTO El hgado graso con frecuencia es  causado por otras enfermedades. El tratamiento para el hgado graso puede incluir medicamentos y cambios en el estilo de vida para controlar enfermedades como:  Alcoholismo.  Colesterol elevado.  Diabetes.  Exceso de peso u obesidad. INSTRUCCIONES PARA EL CUIDADO EN EL HOGAR  Siga una dieta saludable como se lo haya indicado el mdico.  Haga ejercicios regularmente. Esto puede ayudarlo a bajar de peso, y a controlar el colesterol y la diabetes. Hable con el mdico sobre qu actividades son mejores para usted y elaboren un plan de ejercicios.  No beba alcohol.  Tome los medicamentos solamente como se lo haya indicado el mdico. SOLICITE ATENCIN MDICA SI: Tiene dificultad para controlar lo siguiente:  Nivel de azcar en la sangre.  Colesterol.  Consumo de alcohol. SOLICITE ATENCIN MDICA DE INMEDIATO SI:  Siente dolor abdominal.  Tiene ictericia.  Tiene nuseas o vmitos. Esta informacin no tiene como fin reemplazar el consejo del mdico. Asegrese de hacerle al mdico cualquier pregunta que tenga. Document Released: 04/01/2013 Document Revised: 07/02/2014 Document Reviewed: 10/21/2013 Elsevier Interactive Patient Education  2018 Elsevier Inc.  

## 2018-03-27 NOTE — Progress Notes (Signed)
Subjective:  Patient ID: Julie [name], female    DOB: 1965/12/14  Age: 52 y.o. MRN: [id number]  CC: f/u leg edema  HPI  Julie [name] is a 52 y.o. female with a medical history of anemia, pre-diabetes, HLD, bilateral lower extremity edema, s/p hysterectomy, and varicosities presents to f/u on bilateral LE edema. Says edema has for the most part subsided and is only present when she has been standing or walking for prolonged periods. Does not endorse CP, palpitations, SOB, HA, tingling, numbness, abdominal pain, rash, or GI/GU sxs.    Pt asks about elevated liver enzymes. ALP 195 IU/L and ALT 44 IU/L nearly a month ago. Pt states she was found to have fatty liver in [place]. Has not adjusted diet to reduce fatty food intake. Says her culture is used to eating fatty and fried foods.      Outpatient Medications Prior to Visit  Medication Sig Dispense Refill  . atorvastatin (LIPITOR) 40 MG tablet Take 1 tablet (40 mg total) by mouth at bedtime. (Patient not taking: Reported on 03/27/2018) 30 tablet 6  . ferrous sulfate 325 (65 FE) MG tablet TAKE ONE TABLET BY MOUTH ONCE DAILY WITH BREAKFAST (Patient not taking: Reported on 07/30/2017) 90 tablet 3  . Multiple Vitamin (MULTIVITAMIN) tablet Take 1 tablet by mouth daily with lunch.     . cyclobenzaprine (FLEXERIL) 10 MG tablet Take 1 tablet (10 mg total) by mouth at bedtime. 10 tablet 0  . naproxen (NAPROSYN) 500 MG tablet Take 1 tablet (500 mg total) by mouth 2 (two) times daily with a meal. 30 tablet 0   No facility-administered medications prior to visit.      ROS Review of Systems  Constitutional: Negative for chills, fever and malaise/fatigue.  Eyes: Negative for blurred vision.  Respiratory: Negative for shortness of breath.   Cardiovascular: Negative for chest pain and palpitations.  Gastrointestinal: Negative for abdominal pain and nausea.  Genitourinary: Negative for dysuria and hematuria.  Musculoskeletal: Negative  for joint pain and myalgias.  Skin: Negative for rash.  Neurological: Negative for tingling and headaches.  Psychiatric/Behavioral: Negative for depression. The patient is not nervous/anxious.     Objective:  Ht 5\' 1"  (1.549 m)   Wt 179 lb (81.2 kg)   LMP 04/02/2017   BMI 33.82 kg/m   Vitals:   03/27/18 1604  BP: 121/76  Pulse: 60  Temp: 97.6 F (36.4 C)  TempSrc: Oral  SpO2: 96%  Weight: 179 lb (81.2 kg)  Height: 5\' 1"  (1.549 m)       Physical Exam  Constitutional: She is oriented to person, place, and time.  Well developed, well nourished, NAD, polite  HENT:  Head: Normocephalic and atraumatic.  Eyes: No scleral icterus.  Neck: Normal range of motion. Neck supple. No thyromegaly present.  Cardiovascular: Normal rate, regular rhythm and normal heart sounds.  No LE edema bilaterally  Pulmonary/Chest: Effort normal and breath sounds normal.  Abdominal: Soft. Bowel sounds are normal. There is no tenderness.  Musculoskeletal: She exhibits no edema.  Neurological: She is alert and oriented to person, place, and time.  Skin: Skin is warm and dry. No rash noted. No erythema. No pallor.  Psychiatric: She has a normal mood and affect. Her behavior is normal. Thought content normal.  Vitals reviewed.    Assessment & Plan:   1. Elevated liver enzymes - Hepatic Function Panel; Future   Follow-up: Return if symptoms worsen or fail to improve.   Roger  Roderic Ovens PA

## 2018-03-28 ENCOUNTER — Other Ambulatory Visit (INDEPENDENT_AMBULATORY_CARE_PROVIDER_SITE_OTHER): Payer: Medicaid Other

## 2018-03-28 ENCOUNTER — Other Ambulatory Visit (INDEPENDENT_AMBULATORY_CARE_PROVIDER_SITE_OTHER): Payer: Self-pay

## 2018-03-28 DIAGNOSIS — R748 Abnormal levels of other serum enzymes: Secondary | ICD-10-CM

## 2018-03-28 NOTE — Progress Notes (Signed)
Labs collected by onsite [facility] phlebotomist. Tempestt S Roberts, CMA  

## 2018-03-29 LAB — HEPATIC FUNCTION PANEL
ALBUMIN: 4.7 g/dL (ref 3.5–5.5)
ALT: 41 IU/L — ABNORMAL HIGH (ref 0–32)
AST: 33 IU/L (ref 0–40)
Alkaline Phosphatase: 177 IU/L — ABNORMAL HIGH (ref 39–117)
BILIRUBIN TOTAL: 0.3 mg/dL (ref 0.0–1.2)
Bilirubin, Direct: 0.1 mg/dL (ref 0.00–0.40)
TOTAL PROTEIN: 7.1 g/dL (ref 6.0–8.5)

## 2018-03-31 ENCOUNTER — Other Ambulatory Visit (INDEPENDENT_AMBULATORY_CARE_PROVIDER_SITE_OTHER): Payer: Self-pay | Admitting: Physician Assistant

## 2018-03-31 DIAGNOSIS — R748 Abnormal levels of other serum enzymes: Secondary | ICD-10-CM

## 2018-04-01 ENCOUNTER — Telehealth (INDEPENDENT_AMBULATORY_CARE_PROVIDER_SITE_OTHER): Payer: Self-pay

## 2018-04-01 NOTE — Telephone Encounter (Signed)
Call placed using pacific interpreter [phone]) left voicemail notifying patient that elevated lover enzymes persist. RUQ [place] has been ordered. Please call [facility] at [phone] to inform if you have a time preference. Nat Christen, CMA

## 2018-04-01 NOTE — Telephone Encounter (Signed)
-----   Message from Clent Demark, PA-C sent at 03/31/2018  1:00 PM EDT ----- Elevated liver enzymes persist. I have ordered a RUQ [place]. Please notify patient.

## 2018-04-03 ENCOUNTER — Telehealth (INDEPENDENT_AMBULATORY_CARE_PROVIDER_SITE_OTHER): Payer: Self-pay

## 2018-04-03 NOTE — Telephone Encounter (Signed)
CMA asked front office personnel to contact patient and ask if she had date/time preference for [place]. Patient wanted an appointment for Thursday or Friday after 2 pm. Patient informed that [place] only done in the morning as she has to be fasting after midnight the night before [place]. [place] has been scheduled at [facility] on 04/10/18 at 10 am with a 9:45 arrival, NPO after midnight. Patient is aware of appointment details. Nat Christen, CMA

## 2018-04-03 NOTE — Telephone Encounter (Signed)
Call placed using pacific interpreter [phone]) left voicemail asking patient to return call to [facility] at [phone]. Also called patients home number and left detailed message informing patient that elevated liver enzymes persist. Ultrasound has been ordered, please call [facility] to let us know if you have a time/date preference, so that CMA may call and schedule [place] accordingly. Nat Christen, CMA

## 2018-04-04 NOTE — Telephone Encounter (Signed)
Noted  

## 2018-04-10 ENCOUNTER — Ambulatory Visit (HOSPITAL_COMMUNITY)
Admission: RE | Admit: 2018-04-10 | Discharge: 2018-04-10 | Disposition: A | Discharge status: Home / Self Care | Payer: Medicaid Other | Source: Ambulatory Visit | Attending: Physician Assistant | Admitting: Physician Assistant

## 2018-04-10 DIAGNOSIS — R748 Abnormal levels of other serum enzymes: Secondary | ICD-10-CM

## 2018-04-11 ENCOUNTER — Telehealth (INDEPENDENT_AMBULATORY_CARE_PROVIDER_SITE_OTHER): Payer: Self-pay

## 2018-04-11 NOTE — Telephone Encounter (Signed)
-----   Message from Clent Demark, PA-C sent at 04/10/2018  6:25 PM EDT ----- US reveals likely fatty liver. Eat less fatty/fried foods. Exercise regularly.

## 2018-04-11 NOTE — Telephone Encounter (Signed)
Call placed using pacific interpreter [phone]) left voicemail asking patient to return call to [facility] at [phone]. If patient returns call please notify that US reveals likely fatty liver. Eat less fried/fatty foods. And exercise regularly. Nat Christen, CMA

## 2018-04-14 ENCOUNTER — Ambulatory Visit: Payer: Self-pay

## 2018-04-21 ENCOUNTER — Encounter: Payer: Self-pay | Admitting: *Deleted

## 2018-06-09 ENCOUNTER — Encounter (INDEPENDENT_AMBULATORY_CARE_PROVIDER_SITE_OTHER): Payer: Self-pay | Admitting: Physician Assistant

## 2018-06-09 ENCOUNTER — Ambulatory Visit (INDEPENDENT_AMBULATORY_CARE_PROVIDER_SITE_OTHER): Payer: Self-pay | Admitting: Physician Assistant

## 2018-06-09 ENCOUNTER — Other Ambulatory Visit: Payer: Self-pay

## 2018-06-09 VITALS — BP 114/69 | HR 64 | Temp 97.5°F | Ht 61.0 in | Wt 175.4 lb

## 2018-06-09 DIAGNOSIS — G8929 Other chronic pain: Secondary | ICD-10-CM

## 2018-06-09 DIAGNOSIS — M542 Cervicalgia: Secondary | ICD-10-CM

## 2018-06-09 DIAGNOSIS — M25511 Pain in right shoulder: Secondary | ICD-10-CM

## 2018-06-09 DIAGNOSIS — G4700 Insomnia, unspecified: Secondary | ICD-10-CM

## 2018-06-09 DIAGNOSIS — R748 Abnormal levels of other serum enzymes: Secondary | ICD-10-CM

## 2018-06-09 MED ORDER — VITAMIN E 180 MG (400 UNIT) PO CAPS: 400.0000 [IU] | ORAL_CAPSULE | Freq: Every day | ORAL | 1 refills | Status: AC

## 2018-06-09 MED ORDER — MELATONIN 10 MG PO TABS: ORAL_TABLET | ORAL | 0 refills | Status: DC

## 2018-06-09 MED ORDER — NAPROXEN 500 MG PO TABS: 500.0000 mg | ORAL_TABLET | Freq: Two times a day (BID) | ORAL | 0 refills | Status: DC | PRN

## 2018-06-09 MED FILL — NAPROXEN 500 MG TABLET: 500 | 30 days supply | Qty: 60 | Fill #0

## 2018-06-09 NOTE — Patient Instructions (Signed)
Insomnio  (Insomnia)  El insomnio es un trastorno del sueo que causa dificultades para conciliar el sueo o para mantenerlo. Puede producir cansancio (fatiga), falta de energa, dificultad para concentrarse, cambios en el estado de nimo y mal rendimiento escolar o laboral.  Hay tres formas diferentes de clasificar el insomnio:   Dificultad para conciliar el sueo.   Dificultad para mantener el sueo.   Despertar muy precoz por la maana.  Cualquier tipo de insomnio puede ser a largo plazo (crnico) o a corto plazo (agudo). Ambos son frecuentes. Generalmente, el insomnio a corto plazo dura tres meses o menos tiempo. El crnico ocurre al menos tres veces por semana durante ms de tres meses.  CAUSAS  El insomnio puede deberse a otra afeccin, situacin o sustancia, por ejemplo:   Ansiedad.   Algunos medicamentos.   Enfermedad por reflujo gastroesofgico (ERGE) u otras enfermedades gastrointestinales.   Asma y otras enfermedades respiratorias.   Sndrome de las piernas inquietas, apnea del sueo u otros trastornos del sueo.   Dolor crnico.   Menopausia, que puede incluir calores repentinos.   Ictus.   Consumo excesivo de alcohol, tabaco u drogas ilegales.   Depresin.   Cafena.   Trastornos neurolgicos, como enfermedad de Alzheimer.   Hiperactividad tiroidea (hipertiroidismo).  Es posible que la causa del insomnio no se conozca.  FACTORES DE RIESGO  Los factores de riesgo de tener insomnio incluyen lo siguiente:   El sexo. La mujeres se ven ms afectadas que los hombres.   La edad. El insomnio es ms frecuente a medida que una persona envejece.   El estrs. Esto puede incluir su vida profesional o personal.   Los ingresos. El insomnio es ms frecuente en las personas cuyos ingresos son ms bajos.   La falta de actividad fsica.   Los horarios de trabajo irregulares o los turnos nocturnos.   Los viajes a lugares de diferentes zonas horarias.  SIGNOS Y SNTOMAS  Si tiene insomnio, el sntoma  principal es la dificultad para conciliar el sueo o mantenerlo. Esto puede derivar en otros sntomas, por ejemplo:   Sentirse fatigado.   Ponerse nervioso por tener que irse a dormir.   No sentirse descansado por la maana.   Tener dificultad para concentrarse.   Sentirse irritable, ansioso o deprimido.  TRATAMIENTO  El tratamiento para el insomnio depende de la causa. Si se debe a una enfermedad preexistente, el tratamiento se centrar en el abordaje de la enfermedad. El tratamiento tambin puede incluir lo siguiente:   Medicamentos que lo ayuden a dormir.   Asesoramiento psicolgico o terapia.   Cambios en el estilo de vida.  INSTRUCCIONES PARA EL CUIDADO EN EL HOGAR   Tome los medicamentos solamente como se lo haya indicado el mdico.   Establezca horarios habituales para dormir y despertarse. No tome siestas.   Lleve un registro del sueo ya que podra ser de utilidad para que usted y a su mdico puedan determinar qu podra estar causndole insomnio. Incluya lo siguiente:  ? Cundo duerme.  ? Cundo se despierta durante la noche.  ? Qu tan bien duerme.  ? Qu tan relajado se siente al da siguiente.  ? Cualquier efecto secundario de los medicamentos que toma.  ? Lo que usted come y bebe.   Convierta a su habitacin en un lugar cmodo donde sea fcil conciliar el sueo:  ? Coloque persianas o cortinas especiales oscuras que impidan la entrada de la luz del exterior.  ? Para bloquear los ruidos, use   un aparato que reproduce sonidos ambientales o relajantes de fondo.  ? Mantenga baja la temperatura.   Haga ejercicio regularmente como se lo haya indicado el mdico. No haga ejercicio justo antes de la hora de acostarse.   Utilice tcnicas de relajacin para controlar el estrs. Pdale al mdico que le sugiera algunas tcnicas que sean adecuadas para usted. Estos pueden incluir lo siguiente:  ? Ejercicios de respiracin.  ? Rutinas para aliviar la tensin muscular.  ? Visualizacin de escenas  apacibles.   Disminucin del consumo de alcohol, bebidas con cafena y cigarrillos, especialmente cerca de la hora de acostarse, ya que pueden perturbarle el sueo.   No coma en exceso ni consuma comidas picantes justo antes de la hora de acostarse. Esto puede causarle molestias digestivas y dificultades para dormir.   Limite el uso de pantallas antes de la hora de acostarse. Esto incluye lo siguiente:  ? Mirar televisin.  ? Usar el telfono inteligente, la tableta y la computadora.   Siga una rutina. Esto puede ayudarlo a conciliar el sueo ms rpidamente. Intente hacer una actividad tranquila, cepillarse los dientes e irse a la cama a la misma hora todas las noches.   Levntese de la cama si sigue despierto despus de 15minutos de haber intentado dormirse. Mantenga bajas las luces, pero intente leer o hacer una actividad tranquila. Cuando tenga sueo, regrese a la cama.   Conduzca con cuidado. No conduzca si est muy somnoliento.   Concurra a todas las visitas de control, segn le indique su mdico. Esto es importante.  SOLICITE ATENCIN MDICA SI:   Est cansado durante el da o tiene dificultades en su rutina diaria debido a la somnolencia.   Sigue teniendo problemas para dormir o estos empeoran.  SOLICITE ATENCIN MDICA DE INMEDIATO SI:   Tiene pensamientos serios acerca de lastimarse a usted mismo o daar a otra persona.  Esta informacin no tiene como fin reemplazar el consejo del mdico. Asegrese de hacerle al mdico cualquier pregunta que tenga.  Document Released: 06/11/2005 Document Revised: 07/02/2014 Document Reviewed: 03/12/2014  Elsevier Interactive Patient Education  2018 Elsevier Inc[employer]

## 2018-06-09 NOTE — Progress Notes (Addendum)
Subjective:  Patient ID: Julie [name], female    DOB: 1966/05/08  Age: 52 y.o. MRN: [id number]  CC: pain right arm and leg. Insomnia.  HPI Julie [name] a 52 y.o.femalewith a medical history of anemia, pre-diabetes, HLD, bilateral lower extremity edema, s/p hysterectomy, and varicosities presents with right arm and leg pain. Has previously been seen by Dr. Burr Medico but states the cyclobenzaprine she was prescribed was ineffective. Says Naproxen 500 mg and Cyclobenzaprine 10 mg prescribed two months ago was highly effective in resolving right shoulder pain, neck pain, and right lower extremity pain. Does not endorse paresthesias. Also complains of insomnia 2/2 cervicalgia. Has taken OTC "gummy sleep pills" with amelioration of insomnia. Currently only sleeping 3-4 hours per night since finishing gummy pills.     Outpatient Medications Prior to Visit  Medication Sig Dispense Refill  . atorvastatin (LIPITOR) 40 MG tablet Take 1 tablet (40 mg total) by mouth at bedtime. (Patient not taking: Reported on 03/27/2018) 30 tablet 6  . Multiple Vitamin (MULTIVITAMIN) tablet Take 1 tablet by mouth daily with lunch.      No facility-administered medications prior to visit.      ROS Review of Systems  Constitutional: Negative for chills, fever and malaise/fatigue.  Eyes: Negative for blurred vision.  Respiratory: Negative for shortness of breath.   Cardiovascular: Negative for chest pain and palpitations.  Gastrointestinal: Negative for abdominal pain and nausea.  Genitourinary: Negative for dysuria and hematuria.  Musculoskeletal: Negative for joint pain and myalgias.  Skin: Negative for rash.  Neurological: Negative for tingling and headaches.  Psychiatric/Behavioral: Negative for depression. The patient is not nervous/anxious.     Objective:  BP 114/69 (BP Location: Left Arm, Patient Position: Sitting, Cuff Size: Normal)   Pulse 64   Temp (!) 97.5 F (36.4 C) (Oral)    Ht 5\' 1"  (1.549 m)   Wt 175 lb 6.4 oz (79.6 kg)   LMP 04/02/2017   SpO2 96%   BMI 33.14 kg/m   BP/Weight 06/09/2018 41/07/8784 12/29/7207  Systolic BP 470 962 836  Diastolic BP 69 76 71  Wt. (Lbs) 175.4 179 184.4  BMI 33.14 33.82 34.84      Physical Exam Vitals signs reviewed.  Constitutional:      Comments: Well developed, well nourished, NAD, polite  HENT:     Head: Normocephalic and atraumatic.  Eyes:     General: No scleral icterus. Neck:     Musculoskeletal: Normal range of motion and neck supple.     Thyroid: No thyromegaly.  Cardiovascular:     Rate and Rhythm: Normal rate and regular rhythm.     Heart sounds: Normal heart sounds.  Pulmonary:     Effort: Pulmonary effort is normal.     Breath sounds: Normal breath sounds.  Abdominal:     Comments: No hepatosplenomegaly.  Skin:    General: Skin is warm and dry.     Coloration: Skin is not pale.     Findings: No erythema or rash.  Neurological:     Mental Status: She is alert and oriented to person, place, and time.  Psychiatric:        Behavior: Behavior normal.        Thought Content: Thought content normal.      Assessment & Plan:   1. Chronic right shoulder pain - DG Shoulder Right; Future - Begin Naproxen  2. Cervicalgia - Likely attributed to muscle strain.  - Begin Naproxen  3. Insomnia, unspecified  type - Begin Melatonin 10 MG TABS; Take one tablet three hours before bedtime.  Dispense: 30 tablet; Refill: 0  4. Elevated liver enzymes - Hepatitis panel, acute - Hepatic Function Panel   Meds ordered this encounter  Medications  . Melatonin 10 MG TABS    Sig: Take one tablet three hours before bedtime.    Dispense:  30 tablet    Refill:  0    Order Specific Question:   Supervising Provider    Answer:   Charlott Rakes [[id number]]  . vitamin E (VITAMIN E) 400 UNIT capsule    Sig: Take 1 capsule (400 Units total) by mouth daily.    Dispense:  30 capsule    Refill:  1    Order Specific  Question:   Supervising Provider    Answer:   Charlott Rakes [[id number]]  . naproxen (NAPROSYN) 500 MG tablet    Sig: Take 1 tablet (500 mg total) by mouth 2 (two) times daily as needed.    Dispense:  60 tablet    Refill:  0    Order Specific Question:   Supervising Provider    Answer:   Charlott Rakes [[id number]]    Follow-up: Return in about 6 weeks (around 07/21/2018).   Clent Demark PA

## 2018-06-10 ENCOUNTER — Ambulatory Visit (HOSPITAL_COMMUNITY)
Admission: RE | Admit: 2018-06-10 | Discharge: 2018-06-10 | Disposition: A | Discharge status: Home / Self Care | Payer: Self-pay | Source: Ambulatory Visit | Attending: Physician Assistant | Admitting: Physician Assistant

## 2018-06-10 DIAGNOSIS — M25511 Pain in right shoulder: Secondary | ICD-10-CM | POA: Insufficient documentation

## 2018-06-10 DIAGNOSIS — G8929 Other chronic pain: Secondary | ICD-10-CM | POA: Insufficient documentation

## 2018-06-10 LAB — HEPATIC FUNCTION PANEL
ALT: 25 IU/L (ref 0–32)
AST: 21 IU/L (ref 0–40)
Albumin: 4.6 g/dL (ref 3.5–5.5)
Alkaline Phosphatase: 172 IU/L — ABNORMAL HIGH (ref 39–117)
Bilirubin Total: 0.2 mg/dL (ref 0.0–1.2)
Bilirubin, Direct: 0.08 mg/dL (ref 0.00–0.40)
Total Protein: 6.9 g/dL (ref 6.0–8.5)

## 2018-06-10 LAB — HEPATITIS PANEL, ACUTE
Hep A IgM: NEGATIVE
Hep B C IgM: NEGATIVE
Hep C Virus Ab: <0.1 s/co ratio (ref 0.0–0.9)
Hepatitis B Surface Ag: NEGATIVE

## 2018-06-12 ENCOUNTER — Telehealth (INDEPENDENT_AMBULATORY_CARE_PROVIDER_SITE_OTHER): Payer: Self-pay

## 2018-06-12 NOTE — Telephone Encounter (Signed)
Call placed using pacific interpreter Leonel([phone])Left message asking patient to return call to [facility] at [phone]. Nat Christen, CMA

## 2018-06-12 NOTE — Telephone Encounter (Signed)
-----   Message from Clent Demark, PA-C sent at 06/11/2018 12:52 PM EST ----- Right shoulder xray is normal. Keep f/u appointment and we will discuss further.

## 2018-06-16 ENCOUNTER — Telehealth (INDEPENDENT_AMBULATORY_CARE_PROVIDER_SITE_OTHER): Payer: Self-pay

## 2018-06-16 NOTE — Telephone Encounter (Signed)
Call placed using pacific interpreter [phone]) left voicemail on patient phone asking her to return call to [facility]. Then called patients daughter and left detailed message informing that patient xray of right shoulder is normal. Keep follow appointment with PCP to further discuss. Liver enzymes are getting better; there is No hepatitis. Will draw additional blood work at next appointment as one of the liver enzymes is mildly elevated. Call [facility] with any questions or concerns. Nat Christen, CMA

## 2018-06-16 NOTE — Telephone Encounter (Signed)
-----   Message from Clent Demark, PA-C sent at 06/11/2018 12:52 PM EST ----- Right shoulder xray is normal. Keep f/u appointment and we will discuss further.

## 2018-07-21 ENCOUNTER — Encounter (INDEPENDENT_AMBULATORY_CARE_PROVIDER_SITE_OTHER): Payer: Self-pay | Admitting: Internal Medicine

## 2018-07-21 ENCOUNTER — Other Ambulatory Visit: Payer: Self-pay

## 2018-07-21 ENCOUNTER — Ambulatory Visit (INDEPENDENT_AMBULATORY_CARE_PROVIDER_SITE_OTHER): Payer: Self-pay | Admitting: Internal Medicine

## 2018-07-21 VITALS — BP 113/73 | HR 59 | Temp 97.6°F | Ht 61.0 in | Wt 176.6 lb

## 2018-07-21 DIAGNOSIS — Z6833 Body mass index (BMI) 33.0-33.9, adult: Secondary | ICD-10-CM

## 2018-07-21 DIAGNOSIS — M25561 Pain in right knee: Secondary | ICD-10-CM

## 2018-07-21 DIAGNOSIS — M25571 Pain in right ankle and joints of right foot: Secondary | ICD-10-CM

## 2018-07-21 DIAGNOSIS — E669 Obesity, unspecified: Secondary | ICD-10-CM

## 2018-07-21 DIAGNOSIS — E6609 Other obesity due to excess calories: Secondary | ICD-10-CM

## 2018-07-21 DIAGNOSIS — G8929 Other chronic pain: Secondary | ICD-10-CM

## 2018-07-21 DIAGNOSIS — I83893 Varicose veins of bilateral lower extremities with other complications: Secondary | ICD-10-CM

## 2018-07-21 DIAGNOSIS — M25562 Pain in left knee: Secondary | ICD-10-CM

## 2018-07-21 MED ORDER — DICLOFENAC SODIUM 1 % TD GEL: 2.0000 g | Freq: Four times a day (QID) | TRANSDERMAL | 1 refills | Status: DC
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
## 2018-07-22 ENCOUNTER — Ambulatory Visit (HOSPITAL_COMMUNITY)
Admission: RE | Admit: 2018-07-22 | Discharge: 2018-07-22 | Disposition: A | Discharge status: Home / Self Care | Payer: Medicaid Other | Source: Ambulatory Visit | Attending: Internal Medicine | Admitting: Internal Medicine

## 2018-07-22 DIAGNOSIS — G8929 Other chronic pain: Secondary | ICD-10-CM | POA: Insufficient documentation

## 2018-07-22 DIAGNOSIS — M25571 Pain in right ankle and joints of right foot: Secondary | ICD-10-CM | POA: Insufficient documentation

## 2018-07-22 MED FILL — ATORVASTATIN CALCIUM 40 MG: 40 | 30 days supply | Qty: 30 | Fill #0

## 2018-07-22 MED FILL — DICLOFENAC SODIUM 1% GEL: 1 | 12 days supply | Qty: 100 | Fill #0

## 2018-07-25 ENCOUNTER — Telehealth (INDEPENDENT_AMBULATORY_CARE_PROVIDER_SITE_OTHER): Payer: Self-pay

## 2018-07-25 NOTE — Telephone Encounter (Signed)
-----   Message from Ladell Pier, MD sent at 07/23/2018 10:54 AM EST ----- Let pt know that the x-ray of the RT ankle revealed mild arthritis changes of the ankle joint.  Use the antiinflammatory pain rub/gel that I prescribed as needed.

## 2018-07-25 NOTE — Telephone Encounter (Signed)
Call placed using pacific interpreter Minus Liberty [phone]) patient is aware that xray of ankle revealed mild arthritis changes of the ankle joint. Use the antiinflammatory  pain rub/gel that was prescribed as needed. Patient expressed understanding.Nat Christen, CMA

## 2018-09-15 ENCOUNTER — Ambulatory Visit (INDEPENDENT_AMBULATORY_CARE_PROVIDER_SITE_OTHER): Payer: Medicaid Other | Admitting: Primary Care

## 2018-10-29 ENCOUNTER — Ambulatory Visit: Payer: Medicaid Other | Admitting: Primary Care

## 2018-11-06 ENCOUNTER — Encounter: Payer: Self-pay | Admitting: *Deleted

## 2018-11-20 ENCOUNTER — Encounter: Payer: Self-pay | Admitting: *Deleted

## 2019-02-16 ENCOUNTER — Ambulatory Visit: Payer: Self-pay | Attending: Family Medicine

## 2019-02-16 ENCOUNTER — Other Ambulatory Visit: Payer: Self-pay

## 2019-02-18 ENCOUNTER — Other Ambulatory Visit: Payer: Self-pay

## 2019-02-18 ENCOUNTER — Encounter (INDEPENDENT_AMBULATORY_CARE_PROVIDER_SITE_OTHER): Payer: Self-pay | Admitting: Primary Care

## 2019-02-18 ENCOUNTER — Ambulatory Visit (INDEPENDENT_AMBULATORY_CARE_PROVIDER_SITE_OTHER): Payer: Self-pay | Admitting: Primary Care

## 2019-02-18 DIAGNOSIS — M25561 Pain in right knee: Secondary | ICD-10-CM

## 2019-02-18 DIAGNOSIS — E7841 Elevated Lipoprotein(a): Secondary | ICD-10-CM

## 2019-02-18 DIAGNOSIS — M25562 Pain in left knee: Secondary | ICD-10-CM

## 2019-02-18 DIAGNOSIS — Z760 Encounter for issue of repeat prescription: Secondary | ICD-10-CM

## 2019-02-18 DIAGNOSIS — D649 Anemia, unspecified: Secondary | ICD-10-CM

## 2019-02-18 DIAGNOSIS — G8929 Other chronic pain: Secondary | ICD-10-CM

## 2019-02-18 MED ORDER — DICLOFENAC SODIUM 1 % TD GEL: 2.0000 g | Freq: Four times a day (QID) | TRANSDERMAL | 1 refills | Status: DC

## 2019-02-18 MED FILL — DICLOFENAC SODIUM 1% GEL: 1 | 13 days supply | Qty: 100 | Fill #0

## 2019-02-18 NOTE — Progress Notes (Signed)
Pt was feeling agitated a week ago, that has now subsided Pt was having pain in her left arm  Pt was having pain and discomfort in her breast.  States that all pain is gone.

## 2019-02-18 NOTE — Progress Notes (Signed)
Virtual Visit via Telephone Note  I connected with Julie [name] on 02/18/19 at  2:30 PM EDT by telephone and verified that I am speaking with the correct person using two identifiers.   I discussed the limitations, risks, security and privacy concerns of performing an evaluation and management service by telephone and the availability of in person appointments. I also discussed with the patient that there may be a patient responsible charge related to this service. The patient expressed understanding and agreed to proceed.   History of Present Illness: Ms. Julie [name] is being seen for chronic pain bilateral knees and requesting medication refills. Defer refilling cholesterol medication until blood work is completed.   Past Medical History:  Diagnosis Date  . Anemia   . Bloated abdomen    post surgery   . Diarrhea   . Family history of adverse reaction to anesthesia    mother had problems with BP and passed away during surgery  . Gallstones   . Increased thirst    at night  . Nasal dryness    at night  . Nausea & vomiting   . Pre-diabetes   . Swelling of both lower extremities   . Varicose vein of leg    Observations/Objective: Review of Systems  Musculoskeletal: Positive for joint pain.       Bilateral knees  All other systems reviewed and are negative.   Assessment and Plan: Velina was seen today for establish care.  Diagnoses and all orders for this visit:  Elevated lipoprotein(a)   Foods high in cholesterol or liver, fatty meats,cheese, butter avocados, nuts and seeds, chocolate and fried foods. -     Comprehensive metabolic panel; Future -     Lipid panel; Future   Chronic pain of both knees Work on losing weight to help reduce joint pain. May alternate with heat and ice application for pain relief. May also alternate with acetaminophen and Ibuprofen as prescribed pain relief. Other alternatives include massage, acupuncture and water aerobics.  You must  stay active and avoid a sedentary lifestyle. -     diclofenac sodium (VOLTAREN) 1 % GEL; Apply 2 g topically 4 (four) times daily.  Anemia, unspecified type Anemia is a condition in which you lack enough healthy red blood cells to carry adequate oxygen to your body's tissues. Having anemia can make you feel tired and weak. Anemia can be temporary or long term, and it can range from mild to severe -     CBC with Differential/Platelet; Future  Follow Up Instructions:    I discussed the assessment and treatment plan with the patient. The patient was provided an opportunity to ask questions and all were answered. The patient agreed with the plan and demonstrated an understanding of the instructions.   The patient was advised to call back or seek an in-person evaluation if the symptoms worsen or if the condition fails to improve as anticipated.  I provided 22 minutes of non-face-to-face time during this encounter.   Kerin Perna, NP

## 2019-03-03 ENCOUNTER — Other Ambulatory Visit: Payer: Self-pay | Admitting: Registered"

## 2019-03-03 DIAGNOSIS — Z20822 Contact with and (suspected) exposure to covid-19: Secondary | ICD-10-CM

## 2019-03-05 LAB — NOVEL CORONAVIRUS, NAA: SARS-CoV-2, NAA: DETECTED — AB

## 2019-03-17 IMAGING — DX DG SHOULDER 2+V*R*
3 series · 3 of 3 positions shown · non-contrast
Comparison: None

CLINICAL DATA: Chronic RIGHT shoulder pain for 6 months, no known
injury

EXAM:
RIGHT SHOULDER - 2+ VIEW

[shoulder grashey]
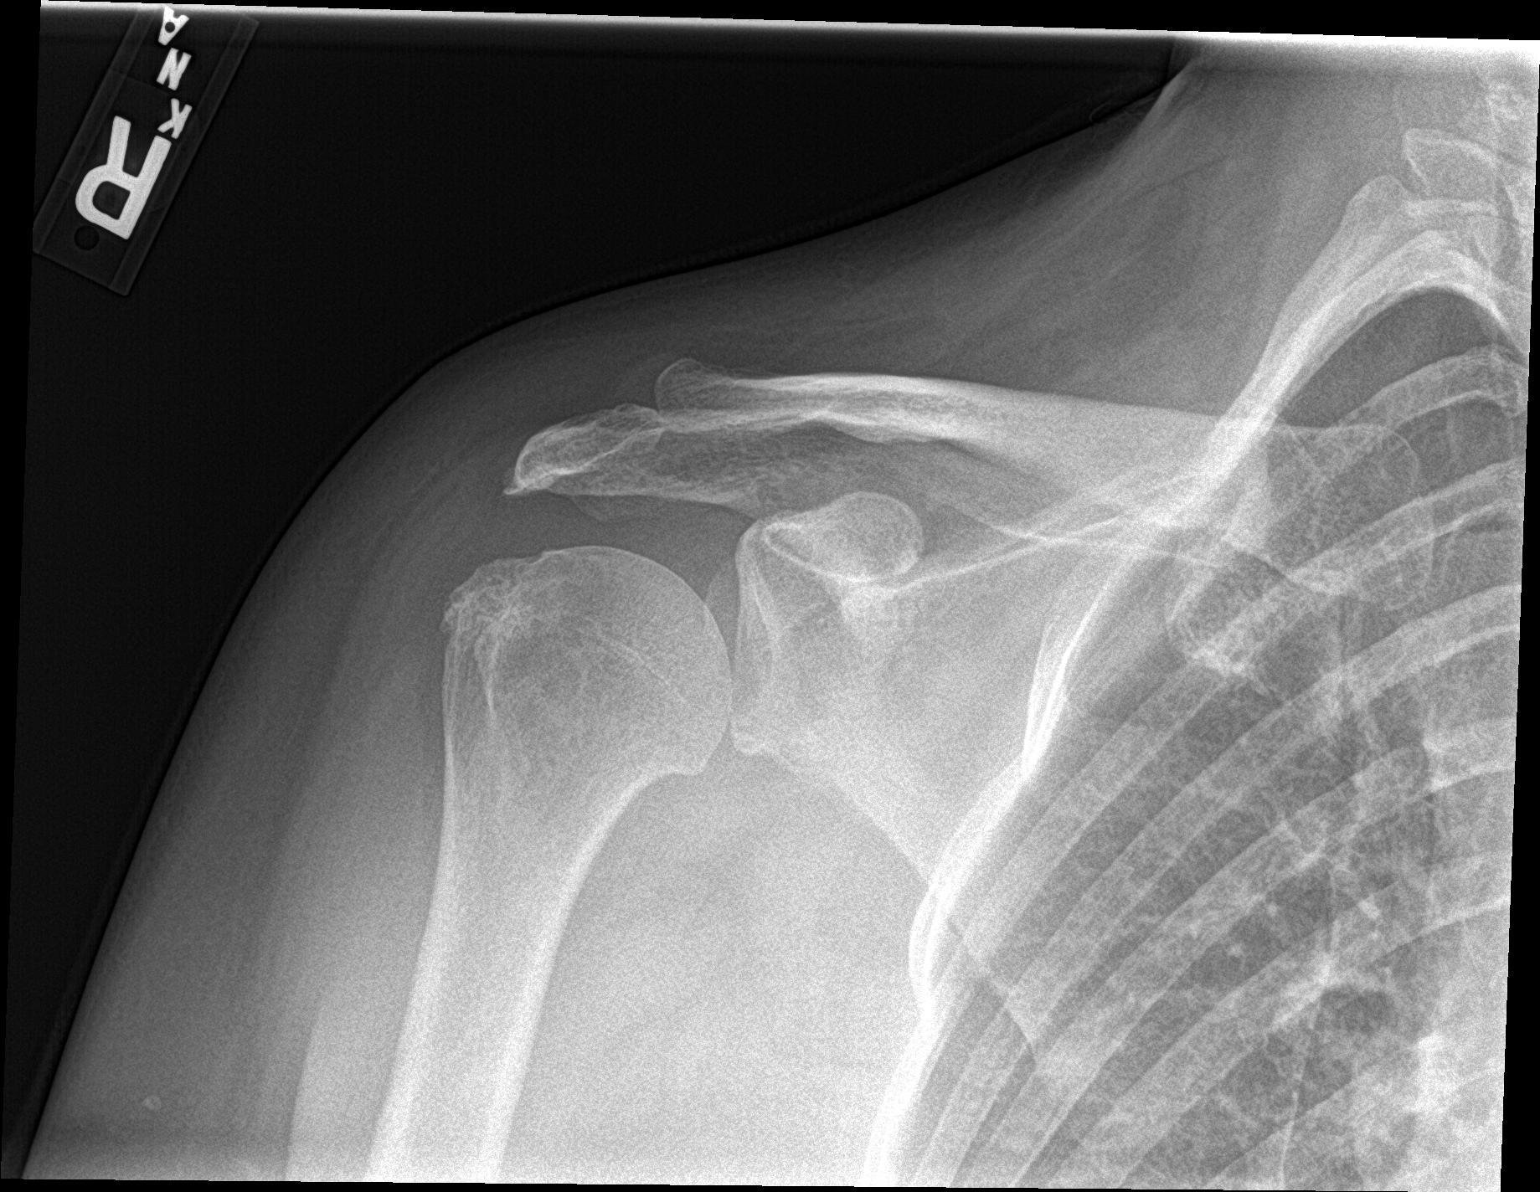

[shoulder y view]
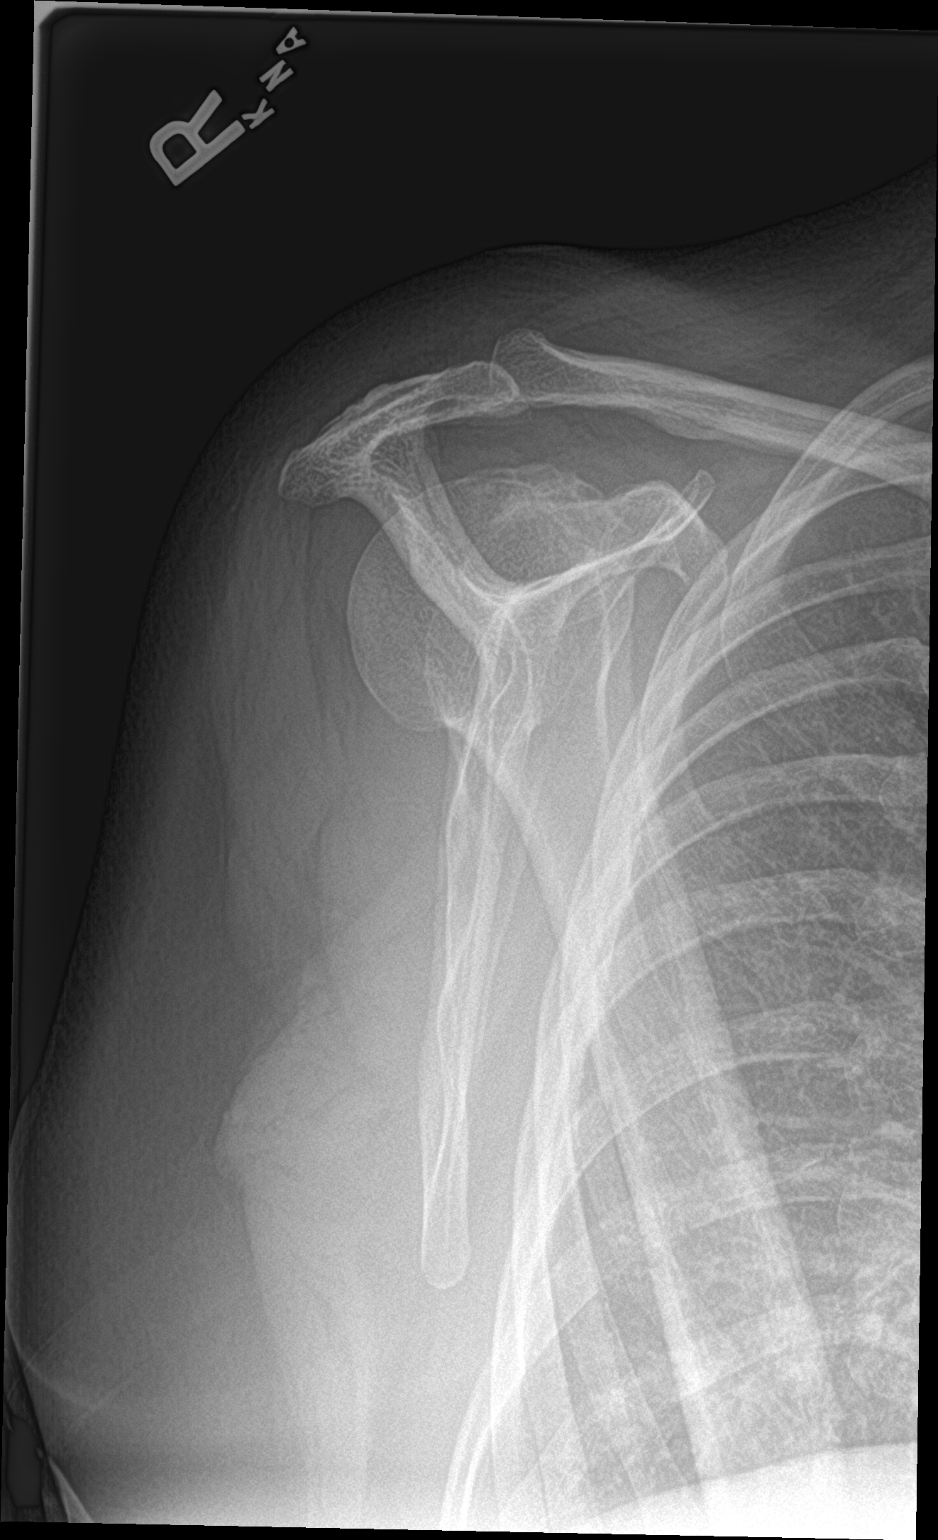

[shoulder axillary]
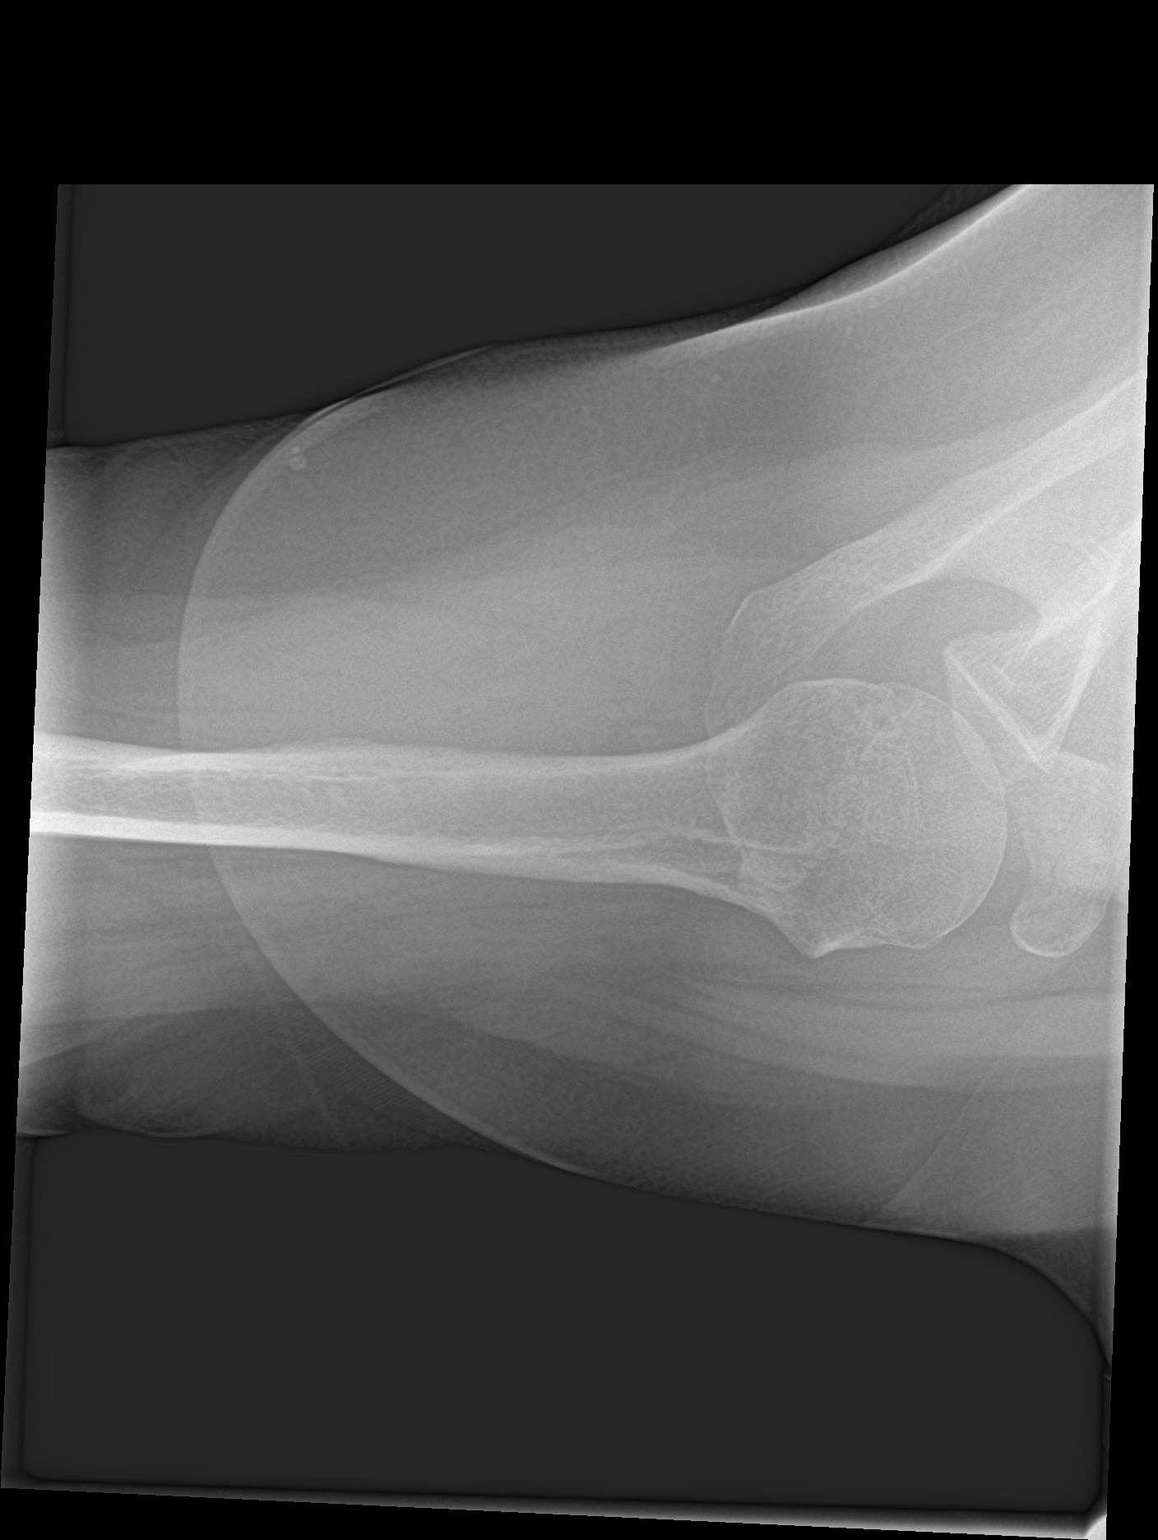

[3 of 3 positions shown; findings below may reference images not displayed]

FINDINGS: Osseous demineralization.

AC joint alignment normal.

No acute fracture, dislocation, or bone destruction.

Visualized RIGHT ribs intact.
IMPRESSION: No acute osseous abnormalities.

## 2019-04-06 ENCOUNTER — Other Ambulatory Visit (INDEPENDENT_AMBULATORY_CARE_PROVIDER_SITE_OTHER): Payer: Medicaid Other

## 2019-04-15 ENCOUNTER — Telehealth (INDEPENDENT_AMBULATORY_CARE_PROVIDER_SITE_OTHER): Payer: Medicaid Other | Admitting: Primary Care

## 2019-04-15 DIAGNOSIS — M545 Low back pain, unspecified: Secondary | ICD-10-CM

## 2019-04-15 DIAGNOSIS — R748 Abnormal levels of other serum enzymes: Secondary | ICD-10-CM

## 2019-04-15 DIAGNOSIS — E785 Hyperlipidemia, unspecified: Secondary | ICD-10-CM

## 2019-04-15 NOTE — Progress Notes (Signed)
Virtual Visit via Web visit  I connected with Julie [name] on 04/15/19 at  3:50 PM EDT by telephone and verified that I am speaking with the correct person using two identifiers.   I discussed the limitations, risks, security and privacy concerns of performing an evaluation and management service by telephone and the availability of in person appointments. I also discussed with the patient that there may be a patient responsible charge related to this service. The patient expressed understanding and agreed to proceed.   History of Present Illness: Ms. Julie [name] was seen today via web visit. She was concerned that she missed her last appointment and labs due to being positive for COVID. States has not had hercholesterol medication in a month. She is concerned about her back pain but asked about her lungs. She denied any urinary problem or shortness of breath or chest pain. Advised to schedule a in person visit for a check up and labs. Past Medical History:  Diagnosis Date  . Anemia   . Bloated abdomen    post surgery   . Diarrhea   . Family history of adverse reaction to anesthesia    mother had problems with BP and passed away during surgery  . Gallstones   . Increased thirst    at night  . Nasal dryness    at night  . Nausea & vomiting   . Pre-diabetes   . Swelling of both lower extremities   . Varicose vein of leg     Observations/Objective: Review of Systems  All other systems reviewed and are negative.   Assessment and Plan: Diagnoses and all orders for this visit:  Elevated liver enzymes -     CMP14+EGFR; Future  Bilateral low back pain, unspecified chronicity, unspecified whether sciatica present Denies falls or heavy lifting or urinary problem . Follow up in person  -     CBC with Differential/Platelet; Future  Hyperlipidemia, unspecified hyperlipidemia type Discussed decreasing your fatty foods, red meat, cheese, milk and increase fiber like  whole grains and veggies. You can also add a fiber supplement like Metamucil or Benefiber.  -     Lipid panel; Future    Follow Up Instructions:    I discussed the assessment and treatment plan with the patient. The patient was provided an opportunity to ask questions and all were answered. The patient agreed with the plan and demonstrated an understanding of the instructions.   The patient was advised to call back or seek an in-person evaluation if the symptoms worsen or if the condition fails to improve as anticipated.  I provided 18 minutes of face-to-face time during this encounter.   Kerin Perna, NP

## 2019-04-24 ENCOUNTER — Other Ambulatory Visit: Payer: Self-pay

## 2019-04-24 ENCOUNTER — Other Ambulatory Visit (INDEPENDENT_AMBULATORY_CARE_PROVIDER_SITE_OTHER): Payer: Medicaid Other

## 2019-04-24 DIAGNOSIS — M545 Low back pain, unspecified: Secondary | ICD-10-CM

## 2019-04-24 DIAGNOSIS — E7841 Elevated Lipoprotein(a): Secondary | ICD-10-CM

## 2019-04-24 DIAGNOSIS — E785 Hyperlipidemia, unspecified: Secondary | ICD-10-CM

## 2019-04-24 DIAGNOSIS — R748 Abnormal levels of other serum enzymes: Secondary | ICD-10-CM

## 2019-04-25 LAB — CBC WITH DIFFERENTIAL/PLATELET
Basophils Absolute: 0 10*3/uL (ref 0.0–0.2)
Basos: 1 %
EOS (ABSOLUTE): 0.1 10*3/uL (ref 0.0–0.4)
Eos: 2 %
Hematocrit: 39.3 % (ref 34.0–46.6)
Hemoglobin: 13.4 g/dL (ref 11.1–15.9)
Immature Grans (Abs): 0 10*3/uL (ref 0.0–0.1)
Immature Granulocytes: 0 %
Lymphocytes Absolute: 1.8 10*3/uL (ref 0.7–3.1)
Lymphs: 44 %
MCH: 29.3 pg (ref 26.6–33.0)
MCHC: 34.1 g/dL (ref 31.5–35.7)
MCV: 86 fL (ref 79–97)
Monocytes Absolute: 0.4 10*3/uL (ref 0.1–0.9)
Monocytes: 10 %
Neutrophils Absolute: 1.7 10*3/uL (ref 1.4–7.0)
Neutrophils: 43 %
Platelets: 303 10*3/uL (ref 150–450)
RBC: 4.58 x10E6/uL (ref 3.77–5.28)
RDW: 13.1 % (ref 11.7–15.4)
WBC: 4 10*3/uL (ref 3.4–10.8)

## 2019-04-25 LAB — LIPID PANEL
Chol/HDL Ratio: 2.8 ratio (ref 0.0–4.4)
Cholesterol, Total: 217 mg/dL — ABNORMAL HIGH (ref 100–199)
HDL: 77 mg/dL (ref >39)
LDL Chol Calc (NIH): 132 mg/dL — ABNORMAL HIGH (ref 0–99)
Triglycerides: 46 mg/dL (ref 0–149)
VLDL Cholesterol Cal: 8 mg/dL (ref 5–40)

## 2019-04-25 LAB — CMP14+EGFR
ALT: 20 IU/L (ref 0–32)
AST: 23 IU/L (ref 0–40)
Albumin/Globulin Ratio: 2.1 (ref 1.2–2.2)
Albumin: 4.8 g/dL (ref 3.8–4.9)
Alkaline Phosphatase: 143 IU/L — ABNORMAL HIGH (ref 39–117)
BUN/Creatinine Ratio: 20 (ref 9–23)
BUN: 10 mg/dL (ref 6–24)
Bilirubin Total: 0.3 mg/dL (ref 0.0–1.2)
CO2: 25 mmol/L (ref 20–29)
Calcium: 10.1 mg/dL (ref 8.7–10.2)
Chloride: 104 mmol/L (ref 96–106)
Creatinine, Ser: 0.5 mg/dL — ABNORMAL LOW (ref 0.57–1.00)
GFR calc Af Amer: 126 mL/min/{1.73_m2} (ref >59)
GFR calc non Af Amer: 109 mL/min/{1.73_m2} (ref >59)
Globulin, Total: 2.3 g/dL (ref 1.5–4.5)
Glucose: 86 mg/dL (ref 65–99)
Potassium: 4.3 mmol/L (ref 3.5–5.2)
Sodium: 141 mmol/L (ref 134–144)
Total Protein: 7.1 g/dL (ref 6.0–8.5)

## 2019-04-26 ENCOUNTER — Other Ambulatory Visit (INDEPENDENT_AMBULATORY_CARE_PROVIDER_SITE_OTHER): Payer: Self-pay | Admitting: Primary Care

## 2019-04-26 DIAGNOSIS — E7841 Elevated Lipoprotein(a): Secondary | ICD-10-CM

## 2019-04-26 MED ORDER — ATORVASTATIN CALCIUM 40 MG PO TABS: 40.0000 mg | ORAL_TABLET | Freq: Every day | ORAL | 3 refills | Status: DC

## 2019-04-27 MED FILL — ATORVASTATIN CALCIUM 40 MG: 40 | 30 days supply | Qty: 30 | Fill #0

## 2019-05-05 ENCOUNTER — Other Ambulatory Visit: Payer: Self-pay | Admitting: Cardiology

## 2019-05-05 DIAGNOSIS — Z20822 Contact with and (suspected) exposure to covid-19: Secondary | ICD-10-CM

## 2019-05-07 LAB — NOVEL CORONAVIRUS, NAA: SARS-CoV-2, NAA: NOT DETECTED

## 2019-05-08 MED FILL — DICLOFENAC SODIUM 1% GEL: 1 | 13 days supply | Qty: 100 | Fill #0

## 2019-05-08 MED FILL — ATORVASTATIN CALCIUM 40 MG: 40 | 30 days supply | Qty: 30 | Fill #0

## 2019-06-23 MED FILL — DICLOFENAC SODIUM 1% GEL: 1 | 12 days supply | Qty: 100 | Fill #1

## 2019-06-23 MED FILL — ?ATORVASTATIN 40MG TABLET: 40 | 30 days supply | Qty: 30 | Fill #1

## 2019-06-24 ENCOUNTER — Ambulatory Visit (INDEPENDENT_AMBULATORY_CARE_PROVIDER_SITE_OTHER): Payer: Self-pay | Admitting: Primary Care

## 2019-06-24 ENCOUNTER — Other Ambulatory Visit: Payer: Self-pay

## 2019-06-24 VITALS — BP 113/67 | HR 68 | Temp 97.5°F | Ht 61.0 in | Wt 177.2 lb

## 2019-06-24 DIAGNOSIS — E7841 Elevated Lipoprotein(a): Secondary | ICD-10-CM

## 2019-06-24 DIAGNOSIS — Z6833 Body mass index (BMI) 33.0-33.9, adult: Secondary | ICD-10-CM

## 2019-06-24 DIAGNOSIS — M545 Low back pain, unspecified: Secondary | ICD-10-CM

## 2019-06-24 DIAGNOSIS — M25561 Pain in right knee: Secondary | ICD-10-CM

## 2019-06-24 DIAGNOSIS — Z8619 Personal history of other infectious and parasitic diseases: Secondary | ICD-10-CM

## 2019-06-24 DIAGNOSIS — M25562 Pain in left knee: Secondary | ICD-10-CM

## 2019-06-24 DIAGNOSIS — G8929 Other chronic pain: Secondary | ICD-10-CM

## 2019-06-24 DIAGNOSIS — E6609 Other obesity due to excess calories: Secondary | ICD-10-CM

## 2019-06-24 MED ORDER — ATORVASTATIN CALCIUM 40 MG PO TABS: 40.0000 mg | ORAL_TABLET | Freq: Every day | ORAL | 3 refills | Status: DC

## 2019-06-24 MED ORDER — DICLOFENAC SODIUM 1 % EX GEL: 4.0000 g | Freq: Four times a day (QID) | CUTANEOUS | 2 refills | Status: DC

## 2019-06-24 NOTE — Progress Notes (Signed)
Established Patient Office Visit  Subjective:  Patient ID: Julie [name], female    DOB: Nov 07, 1965  Age: 53 y.o. MRN: [id number]  CC:  Chief Complaint  Patient presents with  . lung concerns    HPI Julie [name] presents for concerns of back pain and tingling after working 4 hours/standing  at [employer] and this occurs at home to she feels fatigue and wants to get in the bed. She feels this is her lungs this did not happen before she had COVID 3 months.   Past Medical History:  Diagnosis Date  . Anemia   . Bloated abdomen    post surgery   . Diarrhea   . Family history of adverse reaction to anesthesia    mother had problems with BP and passed away during surgery  . Gallstones   . Increased thirst    at night  . Nasal dryness    at night  . Nausea & vomiting   . Pre-diabetes   . Swelling of both lower extremities   . Varicose vein of leg     Past Surgical History:  Procedure Laterality Date  . ABDOMINAL HYSTERECTOMY    . CESAREAN SECTION    . CHOLECYSTECTOMY    . ENDOVENOUS ABLATION SAPHENOUS VEIN W/ LASER Right 07/15/2017   endovenous laser ablation right greater saphenous vein and stab phlebectomy 10-20 incisions right leg by Tinnie Gens MD   . HYSTERECTOMY ABDOMINAL WITH SALPINGECTOMY Bilateral 04/02/2017   Procedure: HYSTERECTOMY ABDOMINAL WITH SALPINGECTOMY;  Surgeon: Mora Bellman, MD;  Location: [facility];  Service: Gynecology;  Laterality: Bilateral;  . LAPAROSCOPIC CHOLECYSTECTOMY W/ CHOLANGIOGRAPHY  01/26/11  . TUBAL LIGATION      Family History  Problem Relation Age of Onset  . Cancer Mother        cervical  . Hypertension Mother   . Esophageal cancer Paternal Uncle   . Cancer Maternal Aunt   . Colon cancer Neg Hx   . Pancreatic cancer Neg Hx   . Rectal cancer Neg Hx   . Stomach cancer Neg Hx     Social History   Socioeconomic History  . Marital status: Married    Spouse name: Not on file  . Number of children: Not on file   . Years of education: Not on file  . Highest education level: Not on file  Occupational History  . Not on file  Tobacco Use  . Smoking status: Never Smoker  . Smokeless tobacco: Never Used  Substance and Sexual Activity  . Alcohol use: No    Comment: occ  . Drug use: No  . Sexual activity: Yes    Birth control/protection: Surgical  Other Topics Concern  . Not on file  Social History Narrative   In the past has had verbal  fights with husband but is improving   Social Determinants of Health   Financial Resource Strain:   . Difficulty of Paying Living Expenses: Not on file  Food Insecurity:   . Worried About Charity fundraiser in the Last Year: Not on file  . Ran Out of Food in the Last Year: Not on file  Transportation Needs:   . Lack of Transportation (Medical): Not on file  . Lack of Transportation (Non-Medical): Not on file  Physical Activity:   . Days of Exercise per Week: Not on file  . Minutes of Exercise per Session: Not on file  Stress:   . Feeling of Stress : Not on  file  Social Connections:   . Frequency of Communication with Friends and Family: Not on file  . Frequency of Social Gatherings with Friends and Family: Not on file  . Attends Religious Services: Not on file  . Active Member of Clubs or Organizations: Not on file  . Attends Archivist Meetings: Not on file  . Marital Status: Not on file  Intimate Partner Violence:   . Fear of Current or Ex-Partner: Not on file  . Emotionally Abused: Not on file  . Physically Abused: Not on file  . Sexually Abused: Not on file    Outpatient Medications Prior to Visit  Medication Sig Dispense Refill  . vitamin E (VITAMIN E) 400 UNIT capsule Take 1 capsule (400 Units total) by mouth daily. 30 capsule 1  . atorvastatin (LIPITOR) 40 MG tablet Take 1 tablet (40 mg total) by mouth at bedtime. 30 tablet 3  . diclofenac sodium (VOLTAREN) 1 % GEL Apply 2 g topically 4 (four) times daily. 100 g 1  . Melatonin  10 MG TABS Take one tablet three hours before bedtime. (Patient not taking: Reported on 02/18/2019) 30 tablet 0   No facility-administered medications prior to visit.    No Known Allergies  ROS Review of Systems  Musculoskeletal: Positive for arthralgias and joint swelling.  All other systems reviewed and are negative.     Objective:    Physical Exam  Constitutional: She is oriented to person, place, and time. She appears well-developed and well-nourished.  obese  HENT:  Head: Normocephalic.  Cardiovascular: Normal rate and regular rhythm.  Pulmonary/Chest: Effort normal and breath sounds normal.  Abdominal: Soft. Bowel sounds are normal.  Musculoskeletal:     Cervical back: Neck supple.     Comments: Left knee crepitus   Neurological: She is oriented to person, place, and time.  Skin: Skin is warm and dry.  Psychiatric: She has a normal mood and affect. Her behavior is normal. Thought content normal.    BP 113/67 (BP Location: Right Arm, Patient Position: Sitting, Cuff Size: Large)   Pulse 68   Temp (!) 97.5 F (36.4 C) (Temporal)   Ht 5\' 1"  (1.549 m)   Wt 177 lb 3.2 oz (80.4 kg)   LMP 04/02/2017   SpO2 94%   BMI 33.48 kg/m  Wt Readings from Last 3 Encounters:  06/24/19 177 lb 3.2 oz (80.4 kg)  07/21/18 176 lb 9.6 oz (80.1 kg)  06/09/18 175 lb 6.4 oz (79.6 kg)     Health Maintenance Due  Topic Date Due  . INFLUENZA VACCINE  01/24/2019  . PAP SMEAR-Modifier  05/17/2019    There are no preventive care reminders to display for this patient.  Lab Results  Component Value Date   TSH 1.950 02/27/2018   Lab Results  Component Value Date   WBC 4.0 04/24/2019   HGB 13.4 04/24/2019   HCT 39.3 04/24/2019   MCV 86 04/24/2019   PLT 303 04/24/2019   Lab Results  Component Value Date   NA 141 04/24/2019   K 4.3 04/24/2019   CO2 25 04/24/2019   GLUCOSE 86 04/24/2019   BUN 10 04/24/2019   CREATININE 0.50 (L) 04/24/2019   BILITOT 0.3 04/24/2019   ALKPHOS  143 (H) 04/24/2019   AST 23 04/24/2019   ALT 20 04/24/2019   PROT 7.1 04/24/2019   ALBUMIN 4.8 04/24/2019   CALCIUM 10.1 04/24/2019   ANIONGAP 7 03/25/2017   Lab Results  Component Value Date  CHOL 217 (H) 04/24/2019   Lab Results  Component Value Date   HDL 77 04/24/2019   Lab Results  Component Value Date   LDLCALC 132 (H) 04/24/2019   Lab Results  Component Value Date   TRIG 46 04/24/2019   Lab Results  Component Value Date   CHOLHDL 2.8 04/24/2019   Lab Results  Component Value Date   HGBA1C 5.3 02/27/2018      Assessment & Plan:  Deasiah was seen today for lung concerns.  Diagnoses and all orders for this visit:  Bilateral low back pain without sciatica, unspecified chronicity  Chronic pain of both knees Work on losing weight to help reduce joint pain. May alternate with heat and ice application for pain relief. May also alternate with acetaminophen and Ibuprofen as prescribed pain relief. Other alternatives include massage, acupuncture and water aerobics.  You must stay active and avoid a sedentary lifestyle.  Elevated lipoprotein(a) Discuss decreasing your fatty foods, red meat, cheese, milk and increase fiber like whole grains and veggies. You can also add a fiber supplement like Metamucil or Benefiber.  -     atorvastatin (LIPITOR) 40 MG tablet; Take 1 tablet (40 mg total) by mouth at bedtime.  Class 1 obesity due to excess calories without serious comorbidity with body mass index (BMI) of 33.0 to 33.9 in adult Obesity is 30-39 indicating an excess in caloric intake or underlining conditions. This may lead to other co-morbidities CVD, diabete. Lifestyle modifications of diet and exercise may reduce obesity.   Other orders -     diclofenac Sodium (VOLTAREN) 1 % GEL; Apply 4 g topically 4 (four) times daily.   Meds ordered this encounter  Medications  . diclofenac Sodium (VOLTAREN) 1 % GEL    Sig: Apply 4 g topically 4 (four) times daily.    Dispense:   350 g    Refill:  2  . atorvastatin (LIPITOR) 40 MG tablet    Sig: Take 1 tablet (40 mg total) by mouth at bedtime.    Dispense:  30 tablet    Refill:  3    Follow-up: Return in about 2 months (around 08/23/2019) for back and knee pain .    Kerin Perna, NP

## 2019-06-24 NOTE — Patient Instructions (Signed)
Dolor en las articulaciones Joint Pain  Varias pueden ser las causas del dolor en las articulaciones. Es probable que desaparezca si sigue las indicaciones del mdico para Community education officer. A veces, es posible que necesite ms tratamiento. Siga estas indicaciones en su casa: Control del dolor, la rigidez y la hinchazn   Si se lo indican, aplique hielo sobre la zona dolorida. ? Ponga el hielo en una bolsa plstica. ? Coloque una [employer] piel y la bolsa de hielo. ? Coloque el hielo durante 37minutos, de 2 a 3veces por da.  Si se lo indican, aplique calor sobre la zona dolorida. Hgalo con la frecuencia que le haya indicado el mdico. Use la fuente de calor que el mdico le recomiende, como una compresa de calor hmedo o una almohadilla trmica. ? Coloque una [employer] piel y la fuente de Freight forwarder. ? Aplique el calor durante 20 a 3minutos. ? Retire la fuente de calor si la piel se pone de color rojo brillante. Esto es muy importante si no puede Education officer, environmental, calor o fro. Puede correr un riesgo mayor de sufrir quemaduras.  Mueva con frecuencia los dedos de la mano o del pie que se encuentran por debajo de la articulacin con dolor. Esto ayuda a reducir el entumecimiento y la hinchazn.  Si es posible, cuando est sentado o acostado, levante (eleve) la articulacin con dolor por encima del nivel del corazn. Para hacerlo, intente colocar algunas almohadas debajo de la articulacin con dolor. Actividad  Descanse la articulacin con dolor durante el tiempo que le indiquen. No haga cosas que le causen dolor o que lo intensifiquen.  Comience a ejercitar o estirar la zona afectada como se lo haya indicado el mdico. Pregntele al mdico qu tipos de ejercicios son seguros para usted. Si tiene un vendaje elstico, cabestrillo o frula:  Use el dispositivo segn las indicaciones del mdico. Quteselos solamente como se lo haya indicado el mdico.  Afloje el dispositivo si los  dedos de la mano o del pie que se encuentran debajo de la articulacin: ? Hormiguean. ? Pierden la sensibilidad (se adormecen). ? Se enfran y se ponen de color azul.  Mantenga limpio el dispositivo.  Pregntele al mdico si debera quitarse el dispositivo antes de baarse. Puede necesitar cubrirlo con un envoltorio hermtico cuando tome un bao de inmersin o una ducha. Instrucciones generales  [employer] de venta libre y los recetados solamente como se lo haya indicado el mdico.  No consuma ningn producto que contenga nicotina o tabaco. Esto incluye cigarrillos y cigarrillos electrnicos. Si necesita ayuda para dejar de fumar, consulte al mdico.  Concurra a todas las visitas de seguimiento como se lo haya indicado el mdico. Esto es importante. Comunquese con un mdico si:  Tiene un dolor que empeora y no mejora con los medicamentos.  El dolor en la articulacin no mejora en el trmino de 3 das.  Tiene ms hinchazn o moretones.  Tiene fiebre.  Pierde 10 libras (4,5 kg) o ms, sin proponrselo. Solicite ayuda de inmediato si:  No puede Nurse, adult.  Los dedos de la mano o del pie se entumecen, pierden la sensibilidad, o se enfran y se ponen de Optician, dispensing.  Tiene fiebre y [place] articulacin que est roja e inflamada, y se siente caliente al tacto. Resumen  Varias pueden ser las causas del dolor en las articulaciones. A menudo desaparece si sigue las indicaciones del mdico para Community education officer.  Descanse la  articulacin con dolor durante el tiempo que le indiquen. No haga cosas que le causen dolor o que lo intensifiquen.  Tome los medicamentos de venta libre y los recetados solamente como se lo haya indicado el mdico. Esta informacin no tiene Marine scientist el consejo del mdico. Asegrese de hacerle al mdico cualquier pregunta que tenga. Document Released: 05/31/2011 Document Revised: 09/21/2017 Document Reviewed: 06/02/2017 Elsevier  Patient Education  2020 [employer].

## 2019-06-24 NOTE — Progress Notes (Signed)
Lung concerns since having covid- states she feels a lot of tingling and pain in back that she did not experience prior to contracting covid

## 2019-07-07 ENCOUNTER — Ambulatory Visit (INDEPENDENT_AMBULATORY_CARE_PROVIDER_SITE_OTHER): Payer: Self-pay

## 2019-07-07 ENCOUNTER — Other Ambulatory Visit: Payer: Self-pay

## 2019-07-07 DIAGNOSIS — Z23 Encounter for immunization: Secondary | ICD-10-CM

## 2019-07-20 MED FILL — ?ATORVASTATIN 40MG TABL: 40 | 30 days supply | Qty: 30 | Fill #2

## 2019-08-12 ENCOUNTER — Other Ambulatory Visit: Payer: Self-pay

## 2019-08-12 ENCOUNTER — Ambulatory Visit (INDEPENDENT_AMBULATORY_CARE_PROVIDER_SITE_OTHER): Payer: Self-pay | Admitting: Primary Care

## 2019-08-12 ENCOUNTER — Encounter (INDEPENDENT_AMBULATORY_CARE_PROVIDER_SITE_OTHER): Payer: Self-pay | Admitting: Primary Care

## 2019-08-12 DIAGNOSIS — Z8616 Personal history of COVID-19: Secondary | ICD-10-CM

## 2019-08-12 DIAGNOSIS — M545 Low back pain, unspecified: Secondary | ICD-10-CM

## 2019-08-12 DIAGNOSIS — E7841 Elevated Lipoprotein(a): Secondary | ICD-10-CM

## 2019-08-12 MED ORDER — DICLOFENAC SODIUM 1 % EX GEL: 4.0000 g | Freq: Four times a day (QID) | CUTANEOUS | 2 refills | Status: AC

## 2019-08-12 MED ORDER — ATORVASTATIN CALCIUM 40 MG PO TABS: 40.0000 mg | ORAL_TABLET | Freq: Every day | ORAL | 1 refills | Status: AC

## 2019-08-12 MED ORDER — IBUPROFEN 800 MG PO TABS: 600.0000 mg | ORAL_TABLET | Freq: Three times a day (TID) | ORAL | 1 refills | Status: AC | PRN

## 2019-08-12 MED FILL — IBUPROFEN 800 MG TABLET: 800 | 30 days supply | Qty: 90 | Fill #0

## 2019-08-12 MED FILL — DICLOFENAC SODIUM 1 % GEL: 1 | 6 days supply | Qty: 100 | Fill #0

## 2019-08-12 NOTE — Progress Notes (Signed)
Throbbing pain in back  Pt believes that since having covid she developed back pain After waking up she feels tingling, uses voltaren, she then lays down and pain subsides a little

## 2019-08-17 MED FILL — ?ATORVASTATIN 40MG TABLET: 40 | 30 days supply | Qty: 30 | Fill #3

## 2019-08-18 NOTE — Progress Notes (Signed)
Established Patient Office Visit  Subjective:  Patient ID: Julie [name], female    DOB: 1965/10/02  Age: 54 y.o. MRN: [id number]  CC:  Chief Complaint  Patient presents with  . Knee Pain    imoroved with medicine   . Back Pain    still the same    HPI Julie [name] presents for complaints that back is still hurting but her knees have improved with medication.   Past Medical History:  Diagnosis Date  . Anemia   . Bloated abdomen    post surgery   . Diarrhea   . Family history of adverse reaction to anesthesia    mother had problems with BP and passed away during surgery  . Gallstones   . Increased thirst    at night  . Nasal dryness    at night  . Nausea & vomiting   . Pre-diabetes   . Swelling of both lower extremities   . Varicose vein of leg     Past Surgical History:  Procedure Laterality Date  . ABDOMINAL HYSTERECTOMY    . CESAREAN SECTION    . CHOLECYSTECTOMY    . ENDOVENOUS ABLATION SAPHENOUS VEIN W/ LASER Right 07/15/2017   endovenous laser ablation right greater saphenous vein and stab phlebectomy 10-20 incisions right leg by Tinnie Gens MD   . HYSTERECTOMY ABDOMINAL WITH SALPINGECTOMY Bilateral 04/02/2017   Procedure: HYSTERECTOMY ABDOMINAL WITH SALPINGECTOMY;  Surgeon: Mora Bellman, MD;  Location: [facility];  Service: Gynecology;  Laterality: Bilateral;  . LAPAROSCOPIC CHOLECYSTECTOMY W/ CHOLANGIOGRAPHY  01/26/11  . TUBAL LIGATION      Family History  Problem Relation Age of Onset  . Cancer Mother        cervical  . Hypertension Mother   . Esophageal cancer Paternal Uncle   . Cancer Maternal Aunt   . Colon cancer Neg Hx   . Pancreatic cancer Neg Hx   . Rectal cancer Neg Hx   . Stomach cancer Neg Hx     Social History   Socioeconomic History  . Marital status: Married    Spouse name: Not on file  . Number of children: Not on file  . Years of education: Not on file  . Highest education level: Not on file  Occupational  History  . Not on file  Tobacco Use  . Smoking status: Never Smoker  . Smokeless tobacco: Never Used  Substance and Sexual Activity  . Alcohol use: No    Comment: occ  . Drug use: No  . Sexual activity: Yes    Birth control/protection: Surgical  Other Topics Concern  . Not on file  Social History Narrative   In the past has had verbal  fights with husband but is improving   Social Determinants of Health   Financial Resource Strain:   . Difficulty of Paying Living Expenses: Not on file  Food Insecurity:   . Worried About Charity fundraiser in the Last Year: Not on file  . Ran Out of Food in the Last Year: Not on file  Transportation Needs:   . Lack of Transportation (Medical): Not on file  . Lack of Transportation (Non-Medical): Not on file  Physical Activity:   . Days of Exercise per Week: Not on file  . Minutes of Exercise per Session: Not on file  Stress:   . Feeling of Stress : Not on file  Social Connections:   . Frequency of Communication with Friends and Family: Not  on file  . Frequency of Social Gatherings with Friends and Family: Not on file  . Attends Religious Services: Not on file  . Active Member of Clubs or Organizations: Not on file  . Attends Archivist Meetings: Not on file  . Marital Status: Not on file  Intimate Partner Violence:   . Fear of Current or Ex-Partner: Not on file  . Emotionally Abused: Not on file  . Physically Abused: Not on file  . Sexually Abused: Not on file    Outpatient Medications Prior to Visit  Medication Sig Dispense Refill  . vitamin E (VITAMIN E) 400 UNIT capsule Take 1 capsule (400 Units total) by mouth daily. 30 capsule 1  . atorvastatin (LIPITOR) 40 MG tablet Take 1 tablet (40 mg total) by mouth at bedtime. 30 tablet 3  . diclofenac Sodium (VOLTAREN) 1 % GEL Apply 4 g topically 4 (four) times daily. 350 g 2   No facility-administered medications prior to visit.    No Known Allergies  ROS Review of  Systems  Musculoskeletal: Positive for back pain.       Bilateral knee pain  All other systems reviewed and are negative.     Objective:    Physical Exam  Constitutional: She is oriented to person, place, and time. She appears well-developed and well-nourished.  HENT:  Head: Normocephalic.  Eyes: Pupils are equal, round, and reactive to light. EOM are normal.  Cardiovascular: Normal rate and regular rhythm.  Pulmonary/Chest: Effort normal and breath sounds normal.  Abdominal: Soft. Bowel sounds are normal.  Musculoskeletal:        General: Normal range of motion.     Cervical back: Normal range of motion and neck supple.  Neurological: She is alert and oriented to person, place, and time. She has normal reflexes.  Skin: Skin is warm and dry.  Psychiatric: She has a normal mood and affect. Her behavior is normal. Judgment and thought content normal.    LMP 04/02/2017  Wt Readings from Last 3 Encounters:  06/24/19 177 lb 3.2 oz (80.4 kg)  07/21/18 176 lb 9.6 oz (80.1 kg)  06/09/18 175 lb 6.4 oz (79.6 kg)     Health Maintenance Due  Topic Date Due  . PAP SMEAR-Modifier  05/17/2019  . MAMMOGRAM  07/31/2019    There are no preventive care reminders to display for this patient.  Lab Results  Component Value Date   TSH 1.950 02/27/2018   Lab Results  Component Value Date   WBC 4.0 04/24/2019   HGB 13.4 04/24/2019   HCT 39.3 04/24/2019   MCV 86 04/24/2019   PLT 303 04/24/2019   Lab Results  Component Value Date   NA 141 04/24/2019   K 4.3 04/24/2019   CO2 25 04/24/2019   GLUCOSE 86 04/24/2019   BUN 10 04/24/2019   CREATININE 0.50 (L) 04/24/2019   BILITOT 0.3 04/24/2019   ALKPHOS 143 (H) 04/24/2019   AST 23 04/24/2019   ALT 20 04/24/2019   PROT 7.1 04/24/2019   ALBUMIN 4.8 04/24/2019   CALCIUM 10.1 04/24/2019   ANIONGAP 7 03/25/2017   Lab Results  Component Value Date   CHOL 217 (H) 04/24/2019   Lab Results  Component Value Date   HDL 77 04/24/2019    Lab Results  Component Value Date   LDLCALC 132 (H) 04/24/2019   Lab Results  Component Value Date   TRIG 46 04/24/2019   Lab Results  Component Value Date   CHOLHDL 2.8  04/24/2019   Lab Results  Component Value Date   HGBA1C 5.3 02/27/2018      Assessment & Plan:  Kealohilani was seen today for knee pain and back pain.  Diagnoses and all orders for this visit:  Bilateral low back pain without sciatica, unspecified chronicity BACK PAIN  Location: bilateral lower back  Quality: throbbing Onset: gradual than content  Worse with: bending lifting   Better with: rest  Radiation: no Trauma: no Best sitting/standing/leaning forward: yes Red Flags Fecal/urinary incontinence: NO  Numbness/Weakness: NO   Fever/chills/sweats: NO Night pain: Yes Unexplained weight loss: NO No relief with bedrest: yes  ibuprofen (ADVIL) 800 MG tablet; Take 1 tablet (800 mg total) by mouth every 8 (eight) hours as needed for moderate pain.  Elevated lipoprotein(a) Elevated cholesterol can increase your risk of heart attack or stroke. Decrease your fatty foods, red meat, cheese, milk and increase fiber like whole grains and veggies.   -     atorvastatin (LIPITOR) 40 MG tablet; Take 1 tablet (40 mg total) by mouth at bedtime.  Other orders -     ibuprofen (ADVIL) 800 MG tablet; Take 1 tablet (800 mg total) by mouth every 8 (eight) hours as needed for moderate pain. -     diclofenac Sodium (VOLTAREN) 1 % GEL; Apply 4 g topically 4 (four) times daily.    Meds ordered this encounter  Medications  . ibuprofen (ADVIL) 800 MG tablet    Sig: Take 1 tablet (800 mg total) by mouth every 8 (eight) hours as needed for moderate pain.    Dispense:  90 tablet    Refill:  1  . atorvastatin (LIPITOR) 40 MG tablet    Sig: Take 1 tablet (40 mg total) by mouth at bedtime.    Dispense:  90 tablet    Refill:  1  . diclofenac Sodium (VOLTAREN) 1 % GEL    Sig: Apply 4 g topically 4 (four) times daily.     Dispense:  350 g    Refill:  2    Follow-up: Return in about 3 months (around 11/09/2019), or if symptoms worsen or fail to improve, for Fasting labs.    Kerin Perna, NP

## 2019-09-23 MED FILL — DICLOFENAC SODIUM 1 % GEL: 1 | 6 days supply | Qty: 100 | Fill #1

## 2019-09-23 MED FILL — ATORVASTATIN CALCIUM 40 MG: 40 | 30 days supply | Qty: 30 | Fill #0

## 2020-11-17 ENCOUNTER — Encounter: Payer: Self-pay | Admitting: *Deleted

## 2022-11-26 ENCOUNTER — Other Ambulatory Visit: Payer: Self-pay

## 2022-11-26 MED ORDER — PROMETHAZINE-DM 6.25-15 MG/5ML PO SYRP
5.0000 mL | ORAL_SOLUTION | ORAL | 0 refills | Status: AC
  Filled 2022-11-26: qty 180, 6d supply, fill #0

## 2022-11-26 MED ORDER — ATORVASTATIN CALCIUM 40 MG PO TABS
40.0000 mg | ORAL_TABLET | Freq: Every day | ORAL | 3 refills | Status: AC
  Filled 2022-11-26: qty 90, 90d supply, fill #0

## 2022-11-26 MED ORDER — ALBUTEROL SULFATE HFA 108 (90 BASE) MCG/ACT IN AERS
2.0000 | INHALATION_SPRAY | Freq: Four times a day (QID) | RESPIRATORY_TRACT | 0 refills | Status: AC | PRN
  Filled 2022-11-26: qty 6.7, 25d supply, fill #0

## 2022-11-26 MED ORDER — AMOXICILLIN-POT CLAVULANATE 875-125 MG PO TABS
1.0000 | ORAL_TABLET | Freq: Two times a day (BID) | ORAL | 0 refills | Status: AC
  Filled 2022-11-26: qty 20, 10d supply, fill #0

## 2024-01-27 ENCOUNTER — Encounter (HOSPITAL_BASED_OUTPATIENT_CLINIC_OR_DEPARTMENT_OTHER): Payer: Self-pay

## 2024-01-27 ENCOUNTER — Other Ambulatory Visit: Payer: Self-pay

## 2024-01-27 ENCOUNTER — Emergency Department (HOSPITAL_BASED_OUTPATIENT_CLINIC_OR_DEPARTMENT_OTHER)
Admission: EM | Admit: 2024-01-27 | Discharge: 2024-01-27 | Disposition: A | Discharge status: Home / Self Care | Payer: Self-pay | Attending: Emergency Medicine | Admitting: Emergency Medicine

## 2024-01-27 DIAGNOSIS — S0502XA Injury of conjunctiva and corneal abrasion without foreign body, left eye, initial encounter: Secondary | ICD-10-CM | POA: Insufficient documentation

## 2024-01-27 DIAGNOSIS — X58XXXA Exposure to other specified factors, initial encounter: Secondary | ICD-10-CM | POA: Insufficient documentation

## 2024-01-27 MED ORDER — FLUORESCEIN SODIUM 1 MG OP STRP
1.0000 | ORAL_STRIP | Freq: Once | OPHTHALMIC | Status: AC
  Administered 2024-01-27: 1 via OPHTHALMIC
  Filled 2024-01-27: qty 1

## 2024-01-27 MED ORDER — TETRACAINE HCL 0.5 % OP SOLN
2.0000 [drp] | Freq: Once | OPHTHALMIC | Status: AC
  Administered 2024-01-27: 2 [drp] via OPHTHALMIC
  Filled 2024-01-27: qty 4

## 2024-01-27 MED ORDER — POLYMYXIN B-TRIMETHOPRIM 10000-0.1 UNIT/ML-% OP SOLN
1.0000 [drp] | OPHTHALMIC | 0 refills | Status: AC
  Filled 2024-01-27: qty 10, 34d supply, fill #0

## 2024-01-27 NOTE — Discharge Instructions (Signed)
Follow up with the eye doctor in the office.   Use the drops as prescribed.

## 2024-01-27 NOTE — ED Provider Notes (Signed)
 [place] EMERGENCY DEPARTMENT AT [facility] Provider Note   CSN: [id number] Arrival date & time: 01/27/24  1750     Patient presents with: Eye Problem   Julie [name] is a 58 y.o. female.   58 yo F with a chief complaints of left eye pain.  Patient thinks that maybe she had something stuck in her eye about 3 to 4 days ago when she was walking and it was very windy and there was some dust and debris in the air.  She denies contact lens use.  Denies woodworking welding or metal grinding.  She said that it did not bother her that much until this morning when she woke up she felt like it was a bit more irritated watery.  She also feels like her nose has been running a little bit.  Has had a little bit of congestion.  No fevers.  No known sick contacts.  A language interpreter was used.  Eye Problem      Prior to Admission medications   Medication Sig Start Date End Date Taking? Authorizing Provider  trimethoprim -polymyxin b  (POLYTRIM ) ophthalmic solution Place 1 drop into the right eye every 4 (four) hours. 01/27/24  Yes Emil Share, DO  albuterol  (VENTOLIN  HFA) 108 (90 Base) MCG/ACT inhaler Inhale 2 puffs every 6 (six) hours as needed for wheezing. 11/24/22     amoxicillin -clavulanate (AUGMENTIN ) 875-125 MG tablet Take 1 tablet by mouth 2 (two) times a day for 10 days. 11/24/22     atorvastatin  (LIPITOR) 40 MG tablet Take 1 tablet (40 mg total) by mouth at bedtime. 08/12/19   Celestia Rosaline SQUIBB, NP  atorvastatin  (LIPITOR) 40 MG tablet Take 1 tablet (40 mg total) by mouth at bedtime. 11/24/22     diclofenac  Sodium (VOLTAREN ) 1 % GEL Apply 4 g topically 4 (four) times daily. 08/12/19   Celestia Rosaline SQUIBB, NP  ibuprofen  (ADVIL ) 800 MG tablet Take 1 tablet (800 mg total) by mouth every 8 (eight) hours as needed for moderate pain. 08/12/19   Celestia Rosaline SQUIBB, NP  promethazine -dextromethorphan (PROMETHAZINE -DM) 6.25-15 MG/5ML syrup Take 5 mLs by mouth every 4 (four) hours. 11/24/22      vitamin E  (VITAMIN E ) 400 UNIT capsule Take 1 capsule (400 Units total) by mouth daily. 06/09/18   Kristy Sharolyn Lenis, PA-C    Allergies: Patient has no known allergies.    Review of Systems  Updated Vital Signs BP 124/80 (BP Location: Right Arm)   Pulse 62   Temp 98.1 F (36.7 C) (Oral)   Resp 18   Ht 4' 9.09 (1.45 m)   Wt 79.4 kg   LMP 04/02/2017   SpO2 99%   BMI 37.75 kg/m   Physical Exam Vitals and nursing note reviewed.  Constitutional:      General: She is not in acute distress.    Appearance: She is well-developed. She is not diaphoretic.  HENT:     Head: Normocephalic and atraumatic.  Eyes:     General: Lids are normal. Lids are everted, no foreign bodies appreciated.     Conjunctiva/sclera:     Left eye: Left conjunctiva is injected.     Pupils: Pupils are equal, round, and reactive to light.     Comments: Watery drainage to the left eye.  No appreciable foreign body.  Corneal abrasion about the lower aspect of the cornea below the iris.  Extraocular motion intact.  Cardiovascular:     Rate and Rhythm: Normal rate and regular rhythm.  Heart sounds: No murmur heard.    No friction rub. No gallop.  Pulmonary:     Effort: Pulmonary effort is normal.     Breath sounds: No wheezing or rales.  Abdominal:     General: There is no distension.     Palpations: Abdomen is soft.     Tenderness: There is no abdominal tenderness.  Musculoskeletal:        General: No tenderness.     Cervical back: Normal range of motion and neck supple.  Skin:    General: Skin is warm and dry.  Neurological:     Mental Status: She is alert and oriented to person, place, and time.  Psychiatric:        Behavior: Behavior normal.     (all labs ordered are listed, but only abnormal results are displayed) Labs Reviewed - No data to display  EKG: None  Radiology: No results found.   Procedures   Medications Ordered in the ED  tetracaine  (PONTOCAINE) 0.5 % ophthalmic  solution 2 drop (2 drops Left Eye Given 01/27/24 1841)  fluorescein  ophthalmic strip 1 strip (1 strip Left Eye Given 01/27/24 1841)                                    Medical Decision Making Risk Prescription drug management.   57 yo F with a chief complaints of left eye pain and irritation.  Going on for a few days but worsening this morning.  Clinically the patient has a corneal abrasion seen on fluorescein  exam.  Will start on topical antibiotics.  Will have her follow-up with ophthalmology in the office.  There are some other complaints about nasal congestion and perhaps some fatigue today.  The patient may have a concomitant viral syndrome.  Will have her follow-up with her PCP in the office.  6:52 PM:  I have discussed the diagnosis/risks/treatment options with the patient and family.  Evaluation and diagnostic testing in the emergency department does not suggest an emergent condition requiring admission or immediate intervention beyond what has been performed at this time.  They will follow up with PCP, optho. We also discussed returning to the ED immediately if new or worsening sx occur. We discussed the sx which are most concerning (e.g., sudden worsening pain, fever, inability to tolerate by mouth) that necessitate immediate return. Medications administered to the patient during their visit and any new prescriptions provided to the patient are listed below.  Medications given during this visit Medications  tetracaine  (PONTOCAINE) 0.5 % ophthalmic solution 2 drop (2 drops Left Eye Given 01/27/24 1841)  fluorescein  ophthalmic strip 1 strip (1 strip Left Eye Given 01/27/24 1841)     The patient appears reasonably screen and/or stabilized for discharge and I doubt any other medical condition or other [facility] requiring further screening, evaluation, or treatment in the ED at this time prior to discharge.       Final diagnoses:  Abrasion of left cornea, initial encounter    ED Discharge  Orders          Ordered    trimethoprim -polymyxin b  (POLYTRIM ) ophthalmic solution  Every 4 hours        01/27/24 1849               Emil Share, DO 01/27/24 1852

## 2024-01-27 NOTE — ED Triage Notes (Signed)
 Pt presents with a sensation of a foreign object in her L eye that is causing tearing that was noted this AM after waking. Pt was in a dusty environment last week. Denies sick contacts. Pt has not tried any OTC gtts or irrigation.

## 2024-01-28 ENCOUNTER — Other Ambulatory Visit: Payer: Self-pay

## 2024-02-05 ENCOUNTER — Other Ambulatory Visit: Payer: Self-pay

## 2024-02-14 ENCOUNTER — Other Ambulatory Visit: Payer: Self-pay

## 2024-02-14 MED ORDER — HYDROCHLOROTHIAZIDE 25 MG PO TABS
25.0000 mg | ORAL_TABLET | Freq: Every day | ORAL | 0 refills | Status: DC
  Filled 2024-02-14: qty 90, 90d supply, fill #0

## 2024-02-17 ENCOUNTER — Other Ambulatory Visit: Payer: Self-pay

## 2024-03-16 ENCOUNTER — Other Ambulatory Visit: Payer: Self-pay

## 2024-03-16 MED ORDER — TRAZODONE HCL 50 MG PO TABS
50.0000 mg | ORAL_TABLET | Freq: Every day | ORAL | 0 refills | Status: AC
  Filled 2024-03-16: qty 90, 45d supply, fill #0

## 2024-03-25 ENCOUNTER — Other Ambulatory Visit: Payer: Self-pay

## 2024-04-03 ENCOUNTER — Other Ambulatory Visit: Payer: Self-pay

## 2024-04-03 MED ORDER — TRAZODONE HCL 50 MG PO TABS
ORAL_TABLET | ORAL | 0 refills | Status: AC
  Filled 2024-04-03: qty 90, 45d supply, fill #0

## 2024-04-03 MED ORDER — HYDROCHLOROTHIAZIDE 25 MG PO TABS
ORAL_TABLET | ORAL | 0 refills | Status: AC
  Filled 2024-04-03: qty 90, 90d supply, fill #0

## 2024-04-09 ENCOUNTER — Other Ambulatory Visit: Payer: Self-pay

## 2024-05-05 ENCOUNTER — Other Ambulatory Visit: Payer: Self-pay

## 2024-05-05 MED ORDER — BUDESONIDE-FORMOTEROL FUMARATE 160-4.5 MCG/ACT IN AERO
2.0000 | INHALATION_SPRAY | Freq: Two times a day (BID) | RESPIRATORY_TRACT | 11 refills | Status: AC
  Filled 2024-05-05: qty 10.2, 30d supply, fill #0

## 2024-05-08 ENCOUNTER — Other Ambulatory Visit: Payer: Self-pay
